# Patient Record
Sex: Female | Born: 1945
Health system: Southern US, Community
[De-identification: ages and names within clinical notes are randomized; demographics above are authoritative.]

## PROBLEM LIST (undated history)

## (undated) DIAGNOSIS — B019 Varicella without complication: Secondary | ICD-10-CM

## (undated) DIAGNOSIS — R42 Dizziness and giddiness: Secondary | ICD-10-CM

## (undated) DIAGNOSIS — M1990 Unspecified osteoarthritis, unspecified site: Secondary | ICD-10-CM

## (undated) DIAGNOSIS — H269 Unspecified cataract: Secondary | ICD-10-CM

## (undated) DIAGNOSIS — M810 Age-related osteoporosis without current pathological fracture: Secondary | ICD-10-CM

## (undated) HISTORY — DX: Age-related osteoporosis without current pathological fracture: M81.0

## (undated) HISTORY — DX: Varicella without complication: B01.9

## (undated) HISTORY — DX: Unspecified cataract: H26.9

## (undated) HISTORY — DX: Unspecified osteoarthritis, unspecified site: M19.90

## (undated) HISTORY — DX: Dizziness and giddiness: R42

---

## 2015-06-21 ENCOUNTER — Encounter: Payer: Self-pay | Admitting: Primary Care

## 2015-06-21 ENCOUNTER — Ambulatory Visit (INDEPENDENT_AMBULATORY_CARE_PROVIDER_SITE_OTHER): Payer: PPO | Admitting: Primary Care

## 2015-06-21 VITALS — BP 116/64 | HR 67 | Temp 97.9°F | Ht 61.0 in | Wt 111.1 lb

## 2015-06-21 DIAGNOSIS — H8110 Benign paroxysmal vertigo, unspecified ear: Secondary | ICD-10-CM | POA: Diagnosis not present

## 2015-06-21 DIAGNOSIS — M1990 Unspecified osteoarthritis, unspecified site: Secondary | ICD-10-CM

## 2015-06-21 DIAGNOSIS — M810 Age-related osteoporosis without current pathological fracture: Secondary | ICD-10-CM | POA: Diagnosis not present

## 2015-06-21 NOTE — Progress Notes (Signed)
Pre visit review using our clinic review tool, if applicable. No additional management support is needed unless otherwise documented below in the visit note. 

## 2015-06-21 NOTE — Progress Notes (Signed)
   Subjective:    Patient ID: Jackie Oneal, female    DOB: [birth year], 69 y.o.   MRN: [id number]  HPI  Jackie Oneal is a 69 year old female who presents today to establish care and discuss the problems mentioned below. Patient presents with old records, will review. Last physical was completed in April 2016.  1) Arthritis: Diagnosed years ago. Located to hands, hips, and upper spine. She will occasionally require tylenol. Overall her symptoms do not bother her on a day-to-day basis.  2) Vertigo: Intermittently for years. She will take an antihistamine occasionally when she develops episodes. Recent episode which is now resolving.   Review of Systems  Constitutional: Negative for unexpected weight change.  HENT: Negative for rhinorrhea.   Respiratory: Negative for cough and shortness of breath.   Cardiovascular: Negative for chest pain.  Gastrointestinal: Negative for diarrhea and constipation.  Genitourinary: Negative for difficulty urinating.  Musculoskeletal: Negative for myalgias and joint swelling.  Skin: Negative for rash.  Neurological: Negative for numbness and headaches.  Psychiatric/Behavioral:       Denies concerns for anxiety and depression.       Past Medical History  Diagnosis Date  . Arthritis   . Chicken pox   . Osteoporosis   . Vertigo     Social History   Social History  . Marital Status: Married    Spouse Name: N/A  . Number of Children: N/A  . Years of Education: N/A   Occupational History  . Not on file.   Social History Main Topics  . Smoking status: Never Smoker   . Smokeless tobacco: Not on file  . Alcohol Use: 0.0 oz/week    0 Standard drinks or equivalent per week     Comment: social  . Drug Use: Not on file  . Sexual Activity: Not on file   Other Topics Concern  . Not on file   Social History Narrative   Married.   2 sons. 3 grandchildren.   Retired. Worked as a Water quality scientist.    Enjoys walking, decorating, reading, sewing.     History reviewed. No pertinent past surgical history.  Family History  Problem Relation Age of Onset  . Arthritis Mother     Allergies  Allergen Reactions  . Sulfa Antibiotics     No current outpatient prescriptions on file prior to visit.   No current facility-administered medications on file prior to visit.    BP 116/64 mmHg  Pulse 67  Temp(Src) 97.9 F (36.6 C) (Oral)  Ht 5\' 1"  (1.549 m)  Wt 111 lb 1.9 oz (50.404 kg)  BMI 21.01 kg/m2  SpO2 99%    Objective:   Physical Exam  Constitutional: She is oriented to person, place, and time. She appears well-nourished.  Cardiovascular: Normal rate and regular rhythm.   Pulmonary/Chest: Effort normal and breath sounds normal.  Neurological: She is alert and oriented to person, place, and time.  Skin: Skin is warm and dry.  Psychiatric: She has a normal mood and affect.          Assessment & Plan:

## 2015-06-21 NOTE — Assessment & Plan Note (Signed)
Endorses history of. Will review records. Takes OTC calcium supplement.

## 2015-06-21 NOTE — Patient Instructions (Signed)
Please schedule a physical with me in April/May 2017. You will also schedule a lab only appointment one week prior. We will discuss your lab results during your physical.  It was a pleasure to meet you today! Please don't hesitate to call me with any questions. Welcome to [facility]!  Happy Thanksgiving!

## 2015-06-21 NOTE — Assessment & Plan Note (Signed)
Diagnosed years ago, located to hands, spine, hips. Occasional tylenol use. Good BMI and exercises daily.

## 2015-06-21 NOTE — Assessment & Plan Note (Signed)
History of for years. Manages well with occasional antihistamine.

## 2015-12-19 ENCOUNTER — Other Ambulatory Visit: Payer: Self-pay | Admitting: Primary Care

## 2015-12-19 DIAGNOSIS — Z1159 Encounter for screening for other viral diseases: Secondary | ICD-10-CM

## 2015-12-19 DIAGNOSIS — Z0000 Encounter for general adult medical examination without abnormal findings: Secondary | ICD-10-CM

## 2015-12-19 DIAGNOSIS — Z13220 Encounter for screening for lipoid disorders: Secondary | ICD-10-CM

## 2015-12-19 DIAGNOSIS — Z131 Encounter for screening for diabetes mellitus: Secondary | ICD-10-CM

## 2015-12-22 ENCOUNTER — Other Ambulatory Visit (INDEPENDENT_AMBULATORY_CARE_PROVIDER_SITE_OTHER): Payer: PPO

## 2015-12-22 DIAGNOSIS — Z131 Encounter for screening for diabetes mellitus: Secondary | ICD-10-CM

## 2015-12-22 DIAGNOSIS — Z13220 Encounter for screening for lipoid disorders: Secondary | ICD-10-CM | POA: Diagnosis not present

## 2015-12-22 DIAGNOSIS — Z0000 Encounter for general adult medical examination without abnormal findings: Secondary | ICD-10-CM

## 2015-12-22 DIAGNOSIS — Z1159 Encounter for screening for other viral diseases: Secondary | ICD-10-CM | POA: Diagnosis not present

## 2015-12-22 LAB — COMPREHENSIVE METABOLIC PANEL
ALBUMIN: 4.6 g/dL (ref 3.5–5.2)
ALT: 11 U/L (ref 0–35)
AST: 16 U/L (ref 0–37)
Alkaline Phosphatase: 49 U/L (ref 39–117)
BUN: 21 mg/dL (ref 6–23)
CHLORIDE: 104 meq/L (ref 96–112)
CO2: 29 meq/L (ref 19–32)
Calcium: 9.6 mg/dL (ref 8.4–10.5)
Creatinine, Ser: 0.85 mg/dL (ref 0.40–1.20)
GFR: 70.74 mL/min (ref >60.00)
Glucose, Bld: 90 mg/dL (ref 70–99)
POTASSIUM: 4.5 meq/L (ref 3.5–5.1)
SODIUM: 140 meq/L (ref 135–145)
Total Bilirubin: 0.9 mg/dL (ref 0.2–1.2)
Total Protein: 6.8 g/dL (ref 6.0–8.3)

## 2015-12-22 LAB — LIPID PANEL
Cholesterol: 213 mg/dL — ABNORMAL HIGH (ref 0–200)
HDL: 50.6 mg/dL (ref >39.00)
LDL CALC: 151 mg/dL — AB (ref 0–99)
NONHDL: 162.71
Total CHOL/HDL Ratio: 4
Triglycerides: 60 mg/dL (ref 0.0–149.0)
VLDL: 12 mg/dL (ref 0.0–40.0)

## 2015-12-22 LAB — HEMOGLOBIN A1C: HEMOGLOBIN A1C: 5.4 % (ref 4.6–6.5)

## 2015-12-22 LAB — VITAMIN D 25 HYDROXY (VIT D DEFICIENCY, FRACTURES): VITD: 24.94 ng/mL — ABNORMAL LOW (ref 30.00–100.00)

## 2015-12-23 LAB — HEPATITIS C ANTIBODY: HCV AB: NEGATIVE

## 2015-12-26 ENCOUNTER — Ambulatory Visit (INDEPENDENT_AMBULATORY_CARE_PROVIDER_SITE_OTHER): Payer: PPO | Admitting: Primary Care

## 2015-12-26 ENCOUNTER — Encounter: Payer: Self-pay | Admitting: Primary Care

## 2015-12-26 VITALS — BP 114/66 | HR 70 | Temp 98.1°F | Ht 61.0 in | Wt 112.0 lb

## 2015-12-26 DIAGNOSIS — Z0000 Encounter for general adult medical examination without abnormal findings: Secondary | ICD-10-CM | POA: Insufficient documentation

## 2015-12-26 DIAGNOSIS — E785 Hyperlipidemia, unspecified: Secondary | ICD-10-CM

## 2015-12-26 DIAGNOSIS — Z23 Encounter for immunization: Secondary | ICD-10-CM | POA: Diagnosis not present

## 2015-12-26 DIAGNOSIS — E559 Vitamin D deficiency, unspecified: Secondary | ICD-10-CM

## 2015-12-26 DIAGNOSIS — E2839 Other primary ovarian failure: Secondary | ICD-10-CM | POA: Diagnosis not present

## 2015-12-26 DIAGNOSIS — M810 Age-related osteoporosis without current pathological fracture: Secondary | ICD-10-CM

## 2015-12-26 DIAGNOSIS — L989 Disorder of the skin and subcutaneous tissue, unspecified: Secondary | ICD-10-CM

## 2015-12-26 DIAGNOSIS — M1990 Unspecified osteoarthritis, unspecified site: Secondary | ICD-10-CM

## 2015-12-26 DIAGNOSIS — Z1239 Encounter for other screening for malignant neoplasm of breast: Secondary | ICD-10-CM

## 2015-12-26 MED ORDER — ZOSTER VACCINE LIVE 19400 UNT/0.65ML ~~LOC~~ SUSR: 0.6500 mL | Freq: Once | SUBCUTANEOUS | Status: DC

## 2015-12-26 NOTE — Progress Notes (Signed)
Pre visit review using our clinic review tool, if applicable. No additional management support is needed unless otherwise documented below in the visit note. 

## 2015-12-26 NOTE — Assessment & Plan Note (Signed)
Slightly above goal, TC of 213, LDL of 151. Likely heredity as she is has low risk factors. Will continue to monitor. Repeat in 1 year.

## 2015-12-26 NOTE — Progress Notes (Signed)
Patient ID: Jackie Oneal, female   DOB: 06/23/1946, 70 y.o.   MRN: [id number]  HPI: Jackie Oneal is a 70 year old female who presents today for her annual wellness visit.  Past Medical History  Diagnosis Date  . Arthritis   . Chicken pox   . Osteoporosis   . Vertigo     Current Outpatient Prescriptions  Medication Sig Dispense Refill  . Lactase (LACTAID PO) Take by mouth daily as needed.      No current facility-administered medications for this visit.    Allergies  Allergen Reactions  . Sulfa Antibiotics     Family History  Problem Relation Age of Onset  . Arthritis Mother     Social History   Social History  . Marital Status: Married    Spouse Name: N/A  . Number of Children: N/A  . Years of Education: N/A   Occupational History  . Not on file.   Social History Main Topics  . Smoking status: Never Smoker   . Smokeless tobacco: Not on file  . Alcohol Use: 0.0 oz/week    0 Standard drinks or equivalent per week     Comment: social  . Drug Use: Not on file  . Sexual Activity: Not on file   Other Topics Concern  . Not on file   Social History Narrative   Married.   2 sons. 3 grandchildren.   Retired. Worked as a Water quality scientist.    Enjoys walking, decorating, reading, sewing.    Hospitiliaztions: None  Health Maintenance:    Flu: Completed in September 2016  Tetanus: Completed in 2015  Pneumovax: Completed in 2015  Prevnar: Has never completed. Due.  Zostavax: Has never completed. Due.  Bone Density: Completed in 2015. Due.  Colonoscopy: Completed in 2016, due in 10 years.  Eye Doctor: Completed 1 year ago. Scheduled for June 2017.  Dental Exam: Completed 1-2 years ago.  Mammogram: Completed in 2015. Due.  Pap: Completed in 2016    Providers: Alma Friendly, PCP   I have personally reviewed and have noted: 1. The patient's medical and social history 2. Their use of alcohol, tobacco or illicit drugs 3. Their current medications and  supplements 4. The patient's functional ability including ADL's, fall risks, home safety risks and  hearing or visual impairment. 5. Diet and physical activities 6. Evidence for depression or mood disorder  Subjective:   Review of Systems:   Constitutional: Denies fever, malaise, fatigue, headache or abrupt weight changes.  HEENT: Denies eye pain, eye redness, ear pain, ringing in the ears, wax buildup, runny nose, nasal congestion, bloody nose, or sore throat. Respiratory: Denies difficulty breathing, shortness of breath, cough or sputum production.   Cardiovascular: Denies chest pain, chest tightness, palpitations or swelling in the hands or feet.  Gastrointestinal: Denies abdominal pain, bloating, constipation, diarrhea or blood in the stool.  GU: Denies urgency, frequency, pain with urination, burning sensation, blood in urine, odor or discharge. Musculoskeletal: Denies decrease in range of motion, difficulty with gait, muscle pain. She's noticed increased swelling and stiffness to her fingers on the bilateral hands for the past 3 months. She will take Aleve every 2-3 days with reduction in inflammation and pain. Denies recent injury/trauma. Skin: She's noticed a new lesion to her left check with increased redness and swelling since moving to [place].  She has a history of precancerous lesions to her skin with 3 biopsies.  Neurological: Denies dizziness, difficulty with memory, difficulty with speech or problems with balance  and coordination.   No other specific complaints in a complete review of systems (except as listed in HPI above).  Objective:  PE:   BP 114/66 mmHg  Pulse 70  Temp(Src) 98.1 F (36.7 C) (Oral)  Ht 5\' 1"  (1.549 m)  Wt 112 lb (50.803 kg)  BMI 21.17 kg/m2  SpO2 98% Wt Readings from Last 3 Encounters:  12/26/15 112 lb (50.803 kg)  06/21/15 111 lb 1.9 oz (50.404 kg)    General: Appears their stated age, well developed, well nourished in NAD. Skin: Warm, dry and  intact. Mildly red lesion to left cheek,  HEENT: Head: normal shape and size; Eyes: sclera white, no icterus, conjunctiva pink, PERRLA and EOMs intact; Ears: Tm's gray and intact, normal light reflex; Nose: mucosa pink and moist, septum midline; Throat/Mouth: Teeth present, mucosa pink and moist, no exudate, lesions or ulcerations noted.  Neck: Normal range of motion. Neck supple, trachea midline. No massses, lumps or thyromegaly present.  Cardiovascular: Normal rate and rhythm. S1,S2 noted.  No murmur, rubs or gallops noted. No JVD or BLE edema. No carotid bruits noted. Pulmonary/Chest: Normal effort and positive vesicular breath sounds. No respiratory distress. No wheezes, rales or ronchi noted.  Abdomen: Soft and nontender. Normal bowel sounds, no bruits noted. No distention or masses noted. Liver, spleen and kidneys non palpable. Musculoskeletal: Normal range of motion. No difficulty with gait. No obvious joint swelling to the finger, but mild disfigurement to several DIP joints. Herbden's node noted to digit on left hand. Neurological: Alert and oriented. Cranial nerves II-XII intact. Coordination normal. +DTRs bilaterally. Psychiatric: Mood and affect normal. Behavior is normal. Judgment and thought content normal.   EKG:  BMET    Component Value Date/Time   NA 140 12/22/2015 0945   K 4.5 12/22/2015 0945   CL 104 12/22/2015 0945   CO2 29 12/22/2015 0945   GLUCOSE 90 12/22/2015 0945   BUN 21 12/22/2015 0945   CREATININE 0.85 12/22/2015 0945   CALCIUM 9.6 12/22/2015 0945    Lipid Panel     Component Value Date/Time   CHOL 213* 12/22/2015 0945   TRIG 60.0 12/22/2015 0945   HDL 50.60 12/22/2015 0945   CHOLHDL 4 12/22/2015 0945   VLDL 12.0 12/22/2015 0945   LDLCALC 151* 12/22/2015 0945    CBC No results found for: WBC, RBC, HGB, HCT, PLT, MCV, MCH, MCHC, RDW, LYMPHSABS, MONOABS, EOSABS, BASOSABS  Hgb A1C Lab Results  Component Value Date   HGBA1C 5.4 12/22/2015       Assessment and Plan:   Medicare Annual Wellness Visit:  Diet: She endorses a healthy diet. Physical activity: Active lifestyle with hiking, walking daily.  Depression/mood screen: Negative Hearing: Intact to whispered voice Visual acuity: Grossly normal, performs annual eye exam  ADLs: Capable Fall risk: None Home safety: Good Cognitive evaluation: Intact to orientation, naming, recall and repetition EOL planning: Adv directives, full code.  Preventative Medicine: Prevnar and Zostavax due. Administered Prevnar today, RX printed for Zostavax for her to obtain in 30 days. Mammogram and Bone density testing ordered. Colonoscopy, Pap, and other immunizations UTD. She is active and eats a healthy diet, commended her on this and encouraged her to continue. Labs today with hyperlipidemia that is slightly above goal, likely hereditary, and low vitamin D levels. Exam with mild joint disfigurement and new lesion to left cheek. Both of which are to be addressed, otherwise unremarkable. Referral placed to Dermatology.  Next appointment: 1 year or sooner if needed.

## 2015-12-26 NOTE — Assessment & Plan Note (Signed)
Vitamin D level of 24 on recent labs. Start Vitamin D 2000 units daily. Will continue to monitor. Bone density testing ordered.

## 2015-12-26 NOTE — Patient Instructions (Addendum)
Your vitamin D levels are too low. I recommend Vitamin D 2000 units daily. This may be purchased over the counter.  Your cholesterol is slightly above goal. We will continue to monitor these levels in the future. I recommend 150 minutes of moderate intensity exercise weekly.   Ensure you are consuming 64 ounces of water daily.  You've been provided with your final pneumonia vaccination today.   Take the prescription for the Shingles vaccination (zostavax) to your pharmacy as they will administer. You must wait at least 30 days from today before obtaining the shingles vaccination.   You will be contacted regarding your mammogram, bone density testing, and referral to Dermatology. Please let us know if you have not heard back within one week.   Follow up in 1 year for repeat physical or sooner if needed.  It was a pleasure to see you today!

## 2015-12-26 NOTE — Assessment & Plan Note (Signed)
Prevnar and Zostavax due. Administered Prevnar today, RX printed for Zostavax for her to obtain in 30 days. Mammogram and Bone density testing ordered. Colonoscopy, Pap, and other immunizations UTD. She is active and eats a healthy diet, commended her on this and encouraged her to continue. Labs today with hyperlipidemia that is slightly above goal, likely hereditary, and low vitamin D levels. Exam with mild joint disfigurement and new lesion to left cheek. Both of which are to be addressed, otherwise unremarkable. Referral placed to Dermatology.  I have personally reviewed and have noted: 1. The patient's medical and social history 2. Their use of alcohol, tobacco or illicit drugs 3. Their current medications and supplements 4. The patient's functional ability including ADL's, fall  risks, home safety risks and  hearing or visual  impairment. 5. Diet and physical activities 6. Evidence for depression or mood disorder

## 2015-12-26 NOTE — Assessment & Plan Note (Signed)
Osteoarthritis to hands, worse recently. Improvement with occasional Aleve. She would not like to pursue any further at this point. Would recommend xrays of hands in the future.

## 2015-12-26 NOTE — Assessment & Plan Note (Signed)
Due for repeat bone density testing, order placed. Will also start Vitamin D as she already takes Calcium. She leads an active lifestyle.

## 2016-01-10 ENCOUNTER — Ambulatory Visit: Payer: PPO

## 2016-01-11 ENCOUNTER — Ambulatory Visit
Admission: RE | Admit: 2016-01-11 | Discharge: 2016-01-11 | Disposition: A | Discharge status: Home / Self Care | Payer: PPO | Source: Ambulatory Visit | Attending: Primary Care | Admitting: Primary Care

## 2016-01-11 ENCOUNTER — Other Ambulatory Visit: Payer: Self-pay | Admitting: Primary Care

## 2016-01-11 DIAGNOSIS — M8588 Other specified disorders of bone density and structure, other site: Secondary | ICD-10-CM | POA: Diagnosis not present

## 2016-01-11 DIAGNOSIS — Z1239 Encounter for other screening for malignant neoplasm of breast: Secondary | ICD-10-CM

## 2016-01-11 DIAGNOSIS — M85852 Other specified disorders of bone density and structure, left thigh: Secondary | ICD-10-CM | POA: Insufficient documentation

## 2016-01-11 DIAGNOSIS — Z1231 Encounter for screening mammogram for malignant neoplasm of breast: Secondary | ICD-10-CM | POA: Diagnosis not present

## 2016-01-11 DIAGNOSIS — E2839 Other primary ovarian failure: Secondary | ICD-10-CM | POA: Diagnosis not present

## 2016-01-15 DIAGNOSIS — H2513 Age-related nuclear cataract, bilateral: Secondary | ICD-10-CM | POA: Diagnosis not present

## 2016-02-09 DIAGNOSIS — H43822 Vitreomacular adhesion, left eye: Secondary | ICD-10-CM | POA: Diagnosis not present

## 2016-03-22 DIAGNOSIS — H35342 Macular cyst, hole, or pseudohole, left eye: Secondary | ICD-10-CM | POA: Diagnosis not present

## 2016-03-26 DIAGNOSIS — L578 Other skin changes due to chronic exposure to nonionizing radiation: Secondary | ICD-10-CM | POA: Diagnosis not present

## 2016-03-26 DIAGNOSIS — L821 Other seborrheic keratosis: Secondary | ICD-10-CM | POA: Diagnosis not present

## 2016-03-26 DIAGNOSIS — D229 Melanocytic nevi, unspecified: Secondary | ICD-10-CM | POA: Diagnosis not present

## 2016-03-26 DIAGNOSIS — L570 Actinic keratosis: Secondary | ICD-10-CM | POA: Diagnosis not present

## 2016-03-26 DIAGNOSIS — D1800 Hemangioma unspecified site: Secondary | ICD-10-CM | POA: Diagnosis not present

## 2016-03-26 DIAGNOSIS — L814 Other melanin hyperpigmentation: Secondary | ICD-10-CM | POA: Diagnosis not present

## 2016-03-29 HISTORY — PX: EYE SURGERY: SHX253

## 2016-04-22 DIAGNOSIS — Z882 Allergy status to sulfonamides status: Secondary | ICD-10-CM | POA: Diagnosis not present

## 2016-04-22 DIAGNOSIS — H35342 Macular cyst, hole, or pseudohole, left eye: Secondary | ICD-10-CM | POA: Diagnosis not present

## 2016-04-22 DIAGNOSIS — H269 Unspecified cataract: Secondary | ICD-10-CM | POA: Diagnosis not present

## 2016-05-03 DIAGNOSIS — H35372 Puckering of macula, left eye: Secondary | ICD-10-CM | POA: Diagnosis not present

## 2016-05-21 ENCOUNTER — Ambulatory Visit (INDEPENDENT_AMBULATORY_CARE_PROVIDER_SITE_OTHER): Payer: PPO

## 2016-05-21 DIAGNOSIS — Z23 Encounter for immunization: Secondary | ICD-10-CM | POA: Diagnosis not present

## 2016-06-28 DIAGNOSIS — H35342 Macular cyst, hole, or pseudohole, left eye: Secondary | ICD-10-CM | POA: Diagnosis not present

## 2016-10-15 DIAGNOSIS — L821 Other seborrheic keratosis: Secondary | ICD-10-CM | POA: Diagnosis not present

## 2016-10-15 DIAGNOSIS — L814 Other melanin hyperpigmentation: Secondary | ICD-10-CM | POA: Diagnosis not present

## 2016-10-15 DIAGNOSIS — D1800 Hemangioma unspecified site: Secondary | ICD-10-CM | POA: Diagnosis not present

## 2016-10-15 DIAGNOSIS — L570 Actinic keratosis: Secondary | ICD-10-CM | POA: Diagnosis not present

## 2017-01-10 DIAGNOSIS — H35342 Macular cyst, hole, or pseudohole, left eye: Secondary | ICD-10-CM | POA: Diagnosis not present

## 2017-03-03 DIAGNOSIS — H2512 Age-related nuclear cataract, left eye: Secondary | ICD-10-CM | POA: Diagnosis not present

## 2017-03-07 DIAGNOSIS — H2512 Age-related nuclear cataract, left eye: Secondary | ICD-10-CM | POA: Diagnosis not present

## 2017-03-11 ENCOUNTER — Encounter: Payer: Self-pay | Admitting: *Deleted

## 2017-03-13 ENCOUNTER — Telehealth: Payer: Self-pay | Admitting: Primary Care

## 2017-03-13 NOTE — Telephone Encounter (Signed)
Left pt message asking to call Ebony Hail back directly at [phone] to schedule AWV + labs with Katha Cabal and CPE with PCP.  *NOTE* Last AWV 12/26/15

## 2017-03-18 ENCOUNTER — Ambulatory Visit: Payer: PPO | Admitting: Anesthesiology

## 2017-03-18 ENCOUNTER — Encounter
Admission: RE | Disposition: A | Discharge status: Home / Self Care | Payer: Self-pay | Source: Ambulatory Visit | Attending: Ophthalmology

## 2017-03-18 ENCOUNTER — Ambulatory Visit
Admission: RE | Admit: 2017-03-18 | Discharge: 2017-03-18 | Disposition: A | Discharge status: Home / Self Care | Payer: PPO | Source: Ambulatory Visit | Attending: Ophthalmology | Admitting: Ophthalmology

## 2017-03-18 ENCOUNTER — Encounter: Payer: Self-pay | Admitting: Anesthesiology

## 2017-03-18 DIAGNOSIS — H2512 Age-related nuclear cataract, left eye: Secondary | ICD-10-CM | POA: Insufficient documentation

## 2017-03-18 DIAGNOSIS — Z882 Allergy status to sulfonamides status: Secondary | ICD-10-CM | POA: Diagnosis not present

## 2017-03-18 HISTORY — PX: CATARACT EXTRACTION W/PHACO: SHX586

## 2017-03-18 SURGERY — PHACOEMULSIFICATION, CATARACT, WITH IOL INSERTION
Anesthesia: Monitor Anesthesia Care | Site: Eye | Laterality: Left | Wound class: Clean

## 2017-03-18 MED ORDER — MIDAZOLAM HCL 2 MG/2ML IJ SOLN
INTRAMUSCULAR | Status: AC
  Filled 2017-03-18: qty 2

## 2017-03-18 MED ORDER — NA CHONDROIT SULF-NA HYALURON 40-17 MG/ML IO SOLN
INTRAOCULAR | Status: DC | PRN
  Administered 2017-03-18: 1 mL via INTRAOCULAR

## 2017-03-18 MED ORDER — POVIDONE-IODINE 5 % OP SOLN
OPHTHALMIC | Status: DC | PRN
  Administered 2017-03-18: 1 via OPHTHALMIC

## 2017-03-18 MED ORDER — ARMC OPHTHALMIC DILATING DROPS
1.0000 "application " | OPHTHALMIC | Status: AC
  Administered 2017-03-18 (×3): 1 via OPHTHALMIC

## 2017-03-18 MED ORDER — CARBACHOL 0.01 % IO SOLN
INTRAOCULAR | Status: DC | PRN
  Administered 2017-03-18: 0.5 mL via INTRAOCULAR

## 2017-03-18 MED ORDER — LIDOCAINE HCL (PF) 4 % IJ SOLN
INTRAOCULAR | Status: DC | PRN
  Administered 2017-03-18: 4 mL via OPHTHALMIC

## 2017-03-18 MED ORDER — FENTANYL CITRATE (PF) 100 MCG/2ML IJ SOLN
INTRAMUSCULAR | Status: AC
  Filled 2017-03-18: qty 2

## 2017-03-18 MED ORDER — LIDOCAINE HCL (PF) 4 % IJ SOLN
INTRAMUSCULAR | Status: AC
  Filled 2017-03-18: qty 5

## 2017-03-18 MED ORDER — FENTANYL CITRATE (PF) 100 MCG/2ML IJ SOLN
INTRAMUSCULAR | Status: DC | PRN
  Administered 2017-03-18: 25 ug via INTRAVENOUS

## 2017-03-18 MED ORDER — EPHEDRINE SULFATE 50 MG/ML IJ SOLN
INTRAMUSCULAR | Status: DC | PRN
  Administered 2017-03-18: 10 mg via INTRAVENOUS

## 2017-03-18 MED ORDER — MOXIFLOXACIN HCL 0.5 % OP SOLN: 1.0000 [drp] | OPHTHALMIC | Status: DC | PRN

## 2017-03-18 MED ORDER — POVIDONE-IODINE 5 % OP SOLN
OPHTHALMIC | Status: AC
  Filled 2017-03-18: qty 30

## 2017-03-18 MED ORDER — EPINEPHRINE PF 1 MG/ML IJ SOLN
INTRAMUSCULAR | Status: AC
  Filled 2017-03-18: qty 2

## 2017-03-18 MED ORDER — SODIUM CHLORIDE 0.9 % IV SOLN
INTRAVENOUS | Status: DC
  Administered 2017-03-18: 12:00:00 via INTRAVENOUS

## 2017-03-18 MED ORDER — MOXIFLOXACIN HCL 0.5 % OP SOLN
OPHTHALMIC | Status: DC | PRN
  Administered 2017-03-18: 0.2 mL via OPHTHALMIC

## 2017-03-18 MED ORDER — MIDAZOLAM HCL 2 MG/2ML IJ SOLN
INTRAMUSCULAR | Status: DC | PRN
  Administered 2017-03-18: 2 mg via INTRAVENOUS

## 2017-03-18 MED ORDER — NA CHONDROIT SULF-NA HYALURON 40-17 MG/ML IO SOLN
INTRAOCULAR | Status: AC
  Filled 2017-03-18: qty 1

## 2017-03-18 MED ORDER — EPINEPHRINE PF 1 MG/ML IJ SOLN
INTRAOCULAR | Status: DC | PRN
  Administered 2017-03-18: 12:00:00 via OPHTHALMIC

## 2017-03-18 SURGICAL SUPPLY — 16 items
GLOVE BIO SURGEON STRL SZ8 (GLOVE) ×3 IMPLANT
GLOVE BIOGEL M 6.5 STRL (GLOVE) ×3 IMPLANT
GLOVE SURG LX 8.0 MICRO (GLOVE) ×2
GLOVE SURG LX STRL 8.0 MICRO (GLOVE) ×1 IMPLANT
GOWN STRL REUS W/ TWL LRG LVL3 (GOWN DISPOSABLE) ×2 IMPLANT
GOWN STRL REUS W/TWL LRG LVL3 (GOWN DISPOSABLE) ×4
LABEL CATARACT MEDS ST (LABEL) ×3 IMPLANT
LENS IOL TECNIS ITEC 20.0 (Intraocular Lens) ×3 IMPLANT
PACK CATARACT (MISCELLANEOUS) ×3 IMPLANT
PACK CATARACT BRASINGTON LX (MISCELLANEOUS) ×3 IMPLANT
PACK EYE AFTER SURG (MISCELLANEOUS) ×3 IMPLANT
SOL BSS BAG (MISCELLANEOUS) ×3
SOLUTION BSS BAG (MISCELLANEOUS) ×1 IMPLANT
SYR 5ML LL (SYRINGE) ×3 IMPLANT
WATER STERILE IRR 250ML POUR (IV SOLUTION) ×3 IMPLANT
WIPE NON LINTING 3.25X3.25 (MISCELLANEOUS) ×3 IMPLANT

## 2017-03-18 NOTE — Discharge Instructions (Signed)
Eye Surgery Discharge Instructions  Expect mild scratchy sensation or mild soreness. DO NOT RUB YOUR EYE!  The day of surgery:  Minimal physical activity, but bed rest is not required  No reading, computer work, or close hand work  No bending, lifting, or straining.  May watch TV  For 24 hours:  No driving, legal decisions, or alcoholic beverages  Safety precautions  Eat anything you prefer: It is better to start with liquids, then soup then solid foods.  _____ Eye patch should be worn until postoperative exam tomorrow.  ____ Solar shield eyeglasses should be worn for comfort in the sunlight/patch while sleeping  Resume all regular medications including aspirin or Coumadin if these were discontinued prior to surgery. You may shower, bathe, shave, or wash your hair. Tylenol may be taken for mild discomfort.  Call your doctor if you experience significant pain, nausea, or vomiting, fever > 101 or other signs of infection. [phone] or [phone] Specific instructions:  Follow-up Information    Birder Robson, MD Follow up.   Specialty:  Ophthalmology Why:  August 22 at 8:30am Contact information: [address] [place] [address] [phone]

## 2017-03-18 NOTE — Anesthesia Preprocedure Evaluation (Addendum)
Anesthesia Evaluation  Patient identified by MRN, date of birth, ID band Patient awake    Reviewed: Allergy & Precautions, NPO status , Patient's Chart, lab work & pertinent test results  Airway Mallampati: III  TM Distance: >3 FB     Dental  (+) Teeth Intact   Pulmonary neg pulmonary ROS,    Pulmonary exam normal        Cardiovascular negative cardio ROS Normal cardiovascular exam     Neuro/Psych vertigo negative psych ROS   GI/Hepatic negative GI ROS, Neg liver ROS,   Endo/Other  negative endocrine ROS  Renal/GU negative Renal ROS  negative genitourinary   Musculoskeletal  (+) Arthritis , Osteoarthritis,    Abdominal Normal abdominal exam  (+)   Peds negative pediatric ROS (+)  Hematology negative hematology ROS (+)   Anesthesia Other Findings Past Medical History: No date: Arthritis No date: Chicken pox No date: Osteoporosis No date: Vertigo  Reproductive/Obstetrics                            Anesthesia Physical Anesthesia Plan  ASA: II  Anesthesia Plan: MAC   Post-op Pain Management:    Induction: Intravenous  PONV Risk Score and Plan:   Airway Management Planned: Nasal Cannula  Additional Equipment:   Intra-op Plan:   Post-operative Plan:   Informed Consent: I have reviewed the patients History and Physical, chart, labs and discussed the procedure including the risks, benefits and alternatives for the proposed anesthesia with the patient or authorized representative who has indicated his/her understanding and acceptance.   Dental advisory given  Plan Discussed with: CRNA and Surgeon  Anesthesia Plan Comments:         Anesthesia Quick Evaluation

## 2017-03-18 NOTE — Anesthesia Postprocedure Evaluation (Signed)
Anesthesia Post Note  Patient: Jackie Oneal  Procedure(s) Performed: Procedure(s) (LRB): CATARACT EXTRACTION PHACO AND INTRAOCULAR LENS PLACEMENT (IOC) (Left)  Patient location during evaluation: PACU Anesthesia Type: MAC Level of consciousness: awake and alert Pain management: pain level controlled Vital Signs Assessment: post-procedure vital signs reviewed and stable Respiratory status: spontaneous breathing, nonlabored ventilation, respiratory function stable and patient connected to nasal cannula oxygen Cardiovascular status: stable and blood pressure returned to baseline Anesthetic complications: no     Last Vitals:  Vitals:   03/18/17 1110 03/18/17 1252  BP:  (!) 99/54  Pulse: 75 76  Resp: 16 15  Temp: (!) 35.6 C   SpO2: 100% 100%    Last Pain:  Vitals:   03/18/17 1252  TempSrc: Oral                 Darlyne Russian

## 2017-03-18 NOTE — H&P (Signed)
All labs reviewed. Abnormal studies sent to patients PCP when indicated.  Previous H&P reviewed, patient examined, there are NO CHANGES.  Jackie Oneal LOUIS8/21/201812:23 PM

## 2017-03-18 NOTE — Anesthesia Post-op Follow-up Note (Signed)
Anesthesia QCDR form completed.        

## 2017-03-18 NOTE — Op Note (Signed)
PREOPERATIVE DIAGNOSIS:  Nuclear sclerotic cataract of the left eye.   POSTOPERATIVE DIAGNOSIS:  Nuclear sclerotic cataract of the left eye.   OPERATIVE PROCEDURE: Procedure(s): CATARACT EXTRACTION PHACO AND INTRAOCULAR LENS PLACEMENT (IOC)   SURGEON:  Birder Robson, MD.   ANESTHESIA:  Anesthesiologist: Alvin Critchley, MD CRNA: Darlyne Russian, CRNA; Rolla Plate, CRNA  1.      Managed anesthesia care. 2.     0.49ml of Shugarcaine was instilled following the paracentesis   COMPLICATIONS:  None.   TECHNIQUE:   Stop and chop   DESCRIPTION OF PROCEDURE:  The patient was examined and consented in the preoperative holding area where the aforementioned topical anesthesia was applied to the left eye and then brought back to the Operating Room where the left eye was prepped and draped in the usual sterile ophthalmic fashion and a lid speculum was placed. A paracentesis was created with the side port blade and the anterior chamber was filled with viscoelastic. A near clear corneal incision was performed with the steel keratome. A continuous curvilinear capsulorrhexis was performed with a cystotome followed by the capsulorrhexis forceps. Hydrodissection and hydrodelineation were carried out with BSS on a blunt cannula. The lens was removed in a stop and chop  technique and the remaining cortical material was removed with the irrigation-aspiration handpiece. The capsular bag was inflated with viscoelastic and the Technis [id number] lens was placed in the capsular bag without complication. The remaining viscoelastic was removed from the eye with the irrigation-aspiration handpiece. The wounds were hydrated. The anterior chamber was flushed with Miostat and the eye was inflated to physiologic pressure. 0.64ml Vigamox was placed in the anterior chamber. The wounds were found to be water tight. The eye was dressed with Vigamox. The patient was given protective glasses to wear throughout the day and a shield with  which to sleep tonight. The patient was also given drops with which to begin a drop regimen today and will follow-up with me in one day.  Implant Name Type Inv. Item Serial No. Manufacturer Lot No. LRB No. Used  LENS IOL DIOP 20.0 - [id number] Intraocular Lens LENS IOL DIOP 20.0 [id number] AMO   Left 1    Procedure(s) with comments: CATARACT EXTRACTION PHACO AND INTRAOCULAR LENS PLACEMENT (IOC) (Left) - [place] 01:23.9 AP% 20.9 CDE 17.58 Fluid Pack lot # [id number]  Electronically signed: [place] 03/18/2017 12:50 PM

## 2017-03-18 NOTE — Transfer of Care (Signed)
Immediate Anesthesia Transfer of Care Note  Patient: Jackie Oneal  Procedure(s) Performed: Procedure(s) with comments: CATARACT EXTRACTION PHACO AND INTRAOCULAR LENS PLACEMENT (IOC) (Left) - [place] 01:23.9 AP% 20.9 CDE 17.58 Fluid Pack lot # [id number]  Patient Location: PACU  Anesthesia Type:MAC  Level of Consciousness: awake, alert  and oriented  Airway & Oxygen Therapy: Patient Spontanous Breathing  Post-op Assessment: Report given to RN and Post -op Vital signs reviewed and stable  Post vital signs: Reviewed and stable  Last Vitals:  Vitals:   03/18/17 1110 03/18/17 1252  BP:  (!) 99/54  Pulse: 75 77  Resp: 16 16  Temp: (!) 35.6 C   SpO2: 100% 100%    Last Pain:  Vitals:   03/18/17 1252  TempSrc: Oral         Complications: No apparent anesthesia complications

## 2017-03-18 NOTE — Anesthesia Procedure Notes (Signed)
Procedure Name: MAC Date/Time: 03/18/2017 12:32 PM Performed by: Darlyne Russian Pre-anesthesia Checklist: Patient identified, Emergency Drugs available, Suction available, Patient being monitored and Timeout performed Patient Re-evaluated:Patient Re-evaluated prior to induction Oxygen Delivery Method: Nasal cannula Placement Confirmation: positive ETCO2

## 2017-03-19 ENCOUNTER — Encounter: Payer: Self-pay | Admitting: Ophthalmology

## 2017-04-08 ENCOUNTER — Ambulatory Visit (INDEPENDENT_AMBULATORY_CARE_PROVIDER_SITE_OTHER): Payer: PPO

## 2017-04-08 VITALS — BP 100/64 | HR 76 | Temp 98.2°F | Ht 60.5 in | Wt 112.8 lb

## 2017-04-08 DIAGNOSIS — E7849 Other hyperlipidemia: Secondary | ICD-10-CM

## 2017-04-08 DIAGNOSIS — M818 Other osteoporosis without current pathological fracture: Secondary | ICD-10-CM | POA: Diagnosis not present

## 2017-04-08 DIAGNOSIS — Z23 Encounter for immunization: Secondary | ICD-10-CM | POA: Diagnosis not present

## 2017-04-08 DIAGNOSIS — Z0000 Encounter for general adult medical examination without abnormal findings: Secondary | ICD-10-CM

## 2017-04-08 DIAGNOSIS — E784 Other hyperlipidemia: Secondary | ICD-10-CM | POA: Diagnosis not present

## 2017-04-08 DIAGNOSIS — E559 Vitamin D deficiency, unspecified: Secondary | ICD-10-CM | POA: Diagnosis not present

## 2017-04-08 LAB — LIPID PANEL
CHOLESTEROL: 218 mg/dL — AB (ref 0–200)
HDL: 64.5 mg/dL (ref >39.00)
LDL CALC: 144 mg/dL — AB (ref 0–99)
NonHDL: 153.27
TRIGLYCERIDES: 47 mg/dL (ref 0.0–149.0)
Total CHOL/HDL Ratio: 3
VLDL: 9.4 mg/dL (ref 0.0–40.0)

## 2017-04-08 LAB — COMPREHENSIVE METABOLIC PANEL
ALK PHOS: 51 U/L (ref 39–117)
ALT: 12 U/L (ref 0–35)
AST: 17 U/L (ref 0–37)
Albumin: 4.7 g/dL (ref 3.5–5.2)
BILIRUBIN TOTAL: 0.9 mg/dL (ref 0.2–1.2)
BUN: 22 mg/dL (ref 6–23)
CO2: 26 mEq/L (ref 19–32)
Calcium: 9.9 mg/dL (ref 8.4–10.5)
Chloride: 103 mEq/L (ref 96–112)
Creatinine, Ser: 0.81 mg/dL (ref 0.40–1.20)
GFR: 74.5 mL/min (ref >60.00)
GLUCOSE: 92 mg/dL (ref 70–99)
Potassium: 3.8 mEq/L (ref 3.5–5.1)
SODIUM: 139 meq/L (ref 135–145)
TOTAL PROTEIN: 6.9 g/dL (ref 6.0–8.3)

## 2017-04-08 LAB — VITAMIN D 25 HYDROXY (VIT D DEFICIENCY, FRACTURES): VITD: 26.08 ng/mL — AB (ref 30.00–100.00)

## 2017-04-08 LAB — HEMOGLOBIN A1C: Hgb A1c MFr Bld: 5.2 % (ref 4.6–6.5)

## 2017-04-08 NOTE — Patient Instructions (Signed)
Jackie Oneal , Thank you for taking time to come for your Medicare Wellness Visit. I appreciate your ongoing commitment to your health goals. Please review the following plan we discussed and let me know if I can assist you in the future.   These are the goals we discussed: Goals    . Increase physical activity          Starting 04/08/2017, I will continue to walk at least 45 min 4-5 days per week.        This is a list of the screening recommended for you and due dates:  Health Maintenance  Topic Date Due  . Mammogram  01/10/2018  . Tetanus Vaccine  09/15/2023  . Colon Cancer Screening  01/09/2025  . Flu Shot  Completed  . DEXA scan (bone density measurement)  Completed  .  Hepatitis C: One time screening is recommended by Center for Disease Control  (CDC) for  adults born from [birth year] through 1963.   Completed  . Pneumonia vaccines  Completed   Preventive Care for Adults  A healthy lifestyle and preventive care can promote health and wellness. Preventive health guidelines for adults include the following key practices.  . A routine yearly physical is a good way to check with your health care provider about your health and preventive screening. It is a chance to share any concerns and updates on your health and to receive a thorough exam.  . Visit your dentist for a routine exam and preventive care every 6 months. Brush your teeth twice a day and floss once a day. Good oral hygiene prevents tooth decay and gum disease.  . The frequency of eye exams is based on your age, health, family medical history, use  of contact lenses, and other factors. Follow your health care provider's ecommendations for frequency of eye exams.  . Eat a healthy diet. Foods like vegetables, fruits, whole grains, low-fat dairy products, and lean protein foods contain the nutrients you need without too many calories. Decrease your intake of foods high in solid fats, added sugars, and salt. Eat the right amount  of calories for you. Get information about a proper diet from your health care provider, if necessary.  . Regular physical exercise is one of the most important things you can do for your health. Most adults should get at least 150 minutes of moderate-intensity exercise (any activity that increases your heart rate and causes you to sweat) each week. In addition, most adults need muscle-strengthening exercises on 2 or more days a week.  Silver Sneakers may be a benefit available to you. To determine eligibility, you may visit the website: www.silversneakers.com or contact program at [phone] Mon-Fri between 8AM-8PM.   . Maintain a healthy weight. The body mass index (BMI) is a screening tool to identify possible weight problems. It provides an estimate of body fat based on height and weight. Your health care provider can find your BMI and can help you achieve or maintain a healthy weight.   For adults 20 years and older: ? A BMI below 18.5 is considered underweight. ? A BMI of 18.5 to 24.9 is normal. ? A BMI of 25 to 29.9 is considered overweight. ? A BMI of 30 and above is considered obese.   . Maintain normal blood lipids and cholesterol levels by exercising and minimizing your intake of saturated fat. Eat a balanced diet with plenty of fruit and vegetables. Blood tests for lipids and cholesterol should begin at age 63  and be repeated every 5 years. If your lipid or cholesterol levels are high, you are over 50, or you are at high risk for heart disease, you may need your cholesterol levels checked more frequently. Ongoing high lipid and cholesterol levels should be treated with medicines if diet and exercise are not working.  . If you smoke, find out from your health care provider how to quit. If you do not use tobacco, please do not start.  . If you choose to drink alcohol, please do not consume more than 2 drinks per day. One drink is considered to be 12 ounces (355 mL) of beer, 5 ounces  (148 mL) of wine, or 1.5 ounces (44 mL) of liquor.  . If you are 70-22 years old, ask your health care provider if you should take aspirin to prevent strokes.  . Use sunscreen. Apply sunscreen liberally and repeatedly throughout the day. You should seek shade when your shadow is shorter than you. Protect yourself by wearing long sleeves, pants, a wide-brimmed hat, and sunglasses year round, whenever you are outdoors.  . Once a month, do a whole body skin exam, using a mirror to look at the skin on your back. Tell your health care provider of new moles, moles that have irregular borders, moles that are larger than a pencil eraser, or moles that have changed in shape or color.

## 2017-04-08 NOTE — Progress Notes (Signed)
PCP notes:   Health maintenance:  Flu vaccine - administered  Abnormal screenings:   None  Patient concerns:   None  Nurse concerns:  None  Next PCP appt:   05/07/17 @ 0815

## 2017-04-08 NOTE — Progress Notes (Signed)
Pre visit review using our clinic review tool, if applicable. No additional management support is needed unless otherwise documented below in the visit note. 

## 2017-04-08 NOTE — Progress Notes (Signed)
Subjective:   Jackie Oneal is a 71 y.o. female who presents for Medicare Annual (Subsequent) preventive examination.  Review of Systems:  N/A Cardiac Risk Factors include: advanced age (>47men, >67 women)     Objective:     Vitals: BP 100/64 (BP Location: Right Arm, Patient Position: Sitting, Cuff Size: Normal)   Pulse 76   Temp 98.2 F (36.8 C) (Oral)   Ht 5' 0.5" (1.537 m) Comment: no shoes  Wt 112 lb 12 oz (51.1 kg)   SpO2 98%   BMI 21.66 kg/m   Body mass index is 21.66 kg/m.   Tobacco History  Smoking Status  . Never Smoker  Smokeless Tobacco  . Never Used     Counseling given: No   Past Medical History:  Diagnosis Date  . Arthritis   . Chicken pox   . Osteoporosis   . Vertigo    Past Surgical History:  Procedure Laterality Date  . CATARACT EXTRACTION W/PHACO Left 03/18/2017   Procedure: CATARACT EXTRACTION PHACO AND INTRAOCULAR LENS PLACEMENT (IOC);  Surgeon: Birder Robson, MD;  Location: [facility] ORS;  Service: Ophthalmology;  Laterality: Left;  [place] 01:23.9 AP% 20.9 CDE 17.58 Fluid Pack lot # [id number]  . EYE SURGERY Left 03/2016   retina   Family History  Problem Relation Age of Onset  . Arthritis Mother    History  Sexual Activity  . Sexual activity: Not on file    Outpatient Encounter Prescriptions as of 04/08/2017  Medication Sig  . Lactase (LACTAID PO) Take by mouth daily as needed.   . [DISCONTINUED] Zoster Vaccine Live, PF, (ZOSTAVAX) 03474 UNT/0.65ML injection Inject 19,400 Units into the skin once. (Patient not taking: Reported on 03/11/2017)   No facility-administered encounter medications on file as of 04/08/2017.     Activities of Daily Living In your present state of health, do you have any difficulty performing the following activities: 04/08/2017  Hearing? N  Vision? N  Difficulty concentrating or making decisions? N  Walking or climbing stairs? N  Dressing or bathing? N  Doing errands, shopping? N  Preparing Food and  eating ? N  Using the Toilet? N  In the past six months, have you accidently leaked urine? N  Do you have problems with loss of bowel control? N  Managing your Medications? N  Managing your Finances? N  Housekeeping or managing your Housekeeping? N  Some recent data might be hidden    Patient Care Team: Pleas Koch, NP as PCP - General (Internal Medicine)    Assessment:     Hearing Screening   125Hz  250Hz  500Hz  1000Hz  2000Hz  3000Hz  4000Hz  6000Hz  8000Hz   Right ear:   40 40 40  40    Left ear:   40 40 40  40    Vision Screening Comments: Last vision exam in August 2017; future appt scheduled Oct 2018   Exercise Activities and Dietary recommendations Current Exercise Habits: Home exercise routine, Type of exercise: walking, Time (Minutes): 45, Frequency (Times/Week): 5, Weekly Exercise (Minutes/Week): 225, Intensity: Moderate, Exercise limited by: None identified  Goals    . Increase physical activity          Starting 04/08/2017, I will continue to walk at least 45 min 4-5 days per week.       Fall Risk Fall Risk  04/08/2017 12/26/2015  Falls in the past year? No No   Depression Screen PHQ 2/9 Scores 04/08/2017 12/26/2015  PHQ - 2 Score 0 0  PHQ- 9 Score  0 -     Cognitive Function MMSE - Mini Mental State Exam 04/08/2017  Orientation to time 5  Orientation to Place 5  Registration 3  Attention/ Calculation 0  Recall 3  Language- name 2 objects 0  Language- repeat 1  Language- follow 3 step command 3  Language- read & follow direction 0  Write a sentence 0  Copy design 0  Total score 20     PLEASE NOTE: A Mini-Cog screen was completed. Maximum score is 20. A value of 0 denotes this part of Folstein MMSE was not completed or the patient failed this part of the Mini-Cog screening.   Mini-Cog Screening Orientation to Time - Max 5 pts Orientation to Place - Max 5 pts Registration - Max 3 pts Recall - Max 3 pts Language Repeat - Max 1 pts Language Follow 3  Step Command - Max 3 pts     Immunization History  Administered Date(s) Administered  . DTaP 09/14/2013  . Influenza, High Dose Seasonal PF 04/18/2015  . Influenza,inj,Quad PF,6+ Mos 05/21/2016, 04/08/2017  . Pneumococcal Conjugate-13 12/26/2015  . Pneumococcal Polysaccharide-23 11/14/2013  . Tdap 09/14/2013  . Zoster 07/24/2016   Screening Tests Health Maintenance  Topic Date Due  . MAMMOGRAM  01/10/2018  . TETANUS/TDAP  09/15/2023  . COLONOSCOPY  01/09/2025  . INFLUENZA VACCINE  Completed  . DEXA SCAN  Completed  . Hepatitis C Screening  Completed  . PNA vac Low Risk Adult  Completed      Plan:     I have personally reviewed and addressed the Medicare Annual Wellness questionnaire and have noted the following in the patient's chart:  A. Medical and social history B. Use of alcohol, tobacco or illicit drugs  C. Current medications and supplements D. Functional ability and status E.  Nutritional status F.  Physical activity G. Advance directives H. List of other physicians I.  Hospitalizations, surgeries, and ER visits in previous 12 months J.  [place] to include hearing, vision, cognitive, depression L. Referrals and appointments - none  In addition, I have reviewed and discussed with patient certain preventive protocols, quality metrics, and best practice recommendations. A written personalized care plan for preventive services as well as general preventive health recommendations were provided to patient.  See attached scanned questionnaire for additional information.   Signed,   Lindell Noe, MHA, BS, LPN Health Coach

## 2017-04-09 NOTE — Progress Notes (Signed)
I reviewed health advisor's note, was available for consultation, and agree with documentation and plan.  

## 2017-04-16 NOTE — Telephone Encounter (Signed)
See 04/08/17

## 2017-05-05 DIAGNOSIS — Z961 Presence of intraocular lens: Secondary | ICD-10-CM | POA: Diagnosis not present

## 2017-05-05 DIAGNOSIS — H35342 Macular cyst, hole, or pseudohole, left eye: Secondary | ICD-10-CM | POA: Diagnosis not present

## 2017-05-07 ENCOUNTER — Encounter: Payer: Self-pay | Admitting: Primary Care

## 2017-05-07 ENCOUNTER — Ambulatory Visit (INDEPENDENT_AMBULATORY_CARE_PROVIDER_SITE_OTHER): Payer: PPO | Admitting: Primary Care

## 2017-05-07 VITALS — BP 112/66 | HR 77 | Temp 98.1°F | Ht 60.5 in | Wt 112.0 lb

## 2017-05-07 DIAGNOSIS — M810 Age-related osteoporosis without current pathological fracture: Secondary | ICD-10-CM

## 2017-05-07 DIAGNOSIS — Z0000 Encounter for general adult medical examination without abnormal findings: Secondary | ICD-10-CM | POA: Diagnosis not present

## 2017-05-07 DIAGNOSIS — E559 Vitamin D deficiency, unspecified: Secondary | ICD-10-CM | POA: Diagnosis not present

## 2017-05-07 DIAGNOSIS — E785 Hyperlipidemia, unspecified: Secondary | ICD-10-CM

## 2017-05-07 NOTE — Assessment & Plan Note (Signed)
Immunizations UTD. Pap, mammogram, bone density, colonoscopy UTD. Repeat Pap, mammogram, bone density in 2019. Recommended to increase exercise, vegetables/fruit/whole grains. Exam unremarkable. Labs stable. Follow up in 1 year.

## 2017-05-07 NOTE — Assessment & Plan Note (Signed)
Osteopenia on Bone Density from 2017, repeat in 2019. Start calcium with vitamin D.

## 2017-05-07 NOTE — Assessment & Plan Note (Addendum)
Discussed to start calcium with vitamin D. Increase exercise.

## 2017-05-07 NOTE — Assessment & Plan Note (Signed)
Stable when compared to 2017, likely hereditary given overall healthy diet and activity level. Will continue to monitor.

## 2017-05-07 NOTE — Progress Notes (Signed)
Subjective:    Patient ID: Jackie Oneal, female    DOB: 1946/03/28, 71 y.o.   MRN: [id number]  HPI  Ms. Farias is a 71 year old female who presents today for complete physical. She under went [place] 2 with our health advisor last week.  Immunizations: -Tetanus: Completed in 2015 -Influenza: Completed this season -Pneumonia: Completed Prevnar in 2017, Pneumovax in 2015 -Shingles: Completed in 2017  Diet: She endorses a healthy diet. Breakfast: Fruit, cereal  Lunch: Restaurants, meat, vegetable, starch Dinner: Pasta, meat, vegetable, starch Snacks: None Desserts: None Beverages: Water, unsweet tea, wine twice weekly  Exercise: Walking 1-2 miles 4 days weekly Eye exam: Completed in 2018, cataracts  Dental exam: Completes annually  Colonoscopy: Completed in 2016 Dexa: Completed in 2017 Pap Smear: Completed in 2016 Mammogram: Completed in 2019 Hep C Screen: Negative in 2017   Review of Systems  Constitutional: Negative for unexpected weight change.  HENT: Negative for rhinorrhea.   Respiratory: Negative for cough and shortness of breath.   Cardiovascular: Negative for chest pain.  Gastrointestinal: Negative for constipation and diarrhea.  Genitourinary: Negative for difficulty urinating and menstrual problem.  Musculoskeletal: Negative for arthralgias and myalgias.  Skin: Negative for rash.  Allergic/Immunologic: Negative for environmental allergies.  Neurological: Negative for dizziness, numbness and headaches.  Psychiatric/Behavioral:       Denies concerns for anxiety and depression       Past Medical History:  Diagnosis Date  . Arthritis   . Chicken pox   . Osteoporosis   . Vertigo      Social History   Social History  . Marital status: Married    Spouse name: N/A  . Number of children: N/A  . Years of education: N/A   Occupational History  . Not on file.   Social History Main Topics  . Smoking status: Never Smoker  . Smokeless tobacco:  Never Used  . Alcohol use 0.0 oz/week     Comment: social  . Drug use: No  . Sexual activity: Not on file   Other Topics Concern  . Not on file   Social History Narrative   Married.   2 sons. 3 grandchildren.   Retired. Worked as a Water quality scientist.    Enjoys walking, decorating, reading, sewing.    Past Surgical History:  Procedure Laterality Date  . CATARACT EXTRACTION W/PHACO Left 03/18/2017   Procedure: CATARACT EXTRACTION PHACO AND INTRAOCULAR LENS PLACEMENT (IOC);  Surgeon: Birder Robson, MD;  Location: [facility] ORS;  Service: Ophthalmology;  Laterality: Left;  [place] 01:23.9 AP% 20.9 CDE 17.58 Fluid Pack lot # [id number] H  . EYE SURGERY Left 03/2016   retina    Family History  Problem Relation Age of Onset  . Arthritis Mother     Allergies  Allergen Reactions  . Sulfa Antibiotics     Current Outpatient Prescriptions on File Prior to Visit  Medication Sig Dispense Refill  . Lactase (LACTAID PO) Take by mouth daily as needed.      No current facility-administered medications on file prior to visit.     BP 112/66   Pulse 77   Temp 98.1 F (36.7 C) (Oral)   Ht 5' 0.5" (1.537 m)   Wt 112 lb (50.8 kg)   SpO2 98%   BMI 21.51 kg/m    Objective:   Physical Exam  Constitutional: She is oriented to person, place, and time. She appears well-nourished.  HENT:  Right Ear: Tympanic membrane and ear canal normal.  Left Ear:  Tympanic membrane and ear canal normal.  Nose: Nose normal.  Mouth/Throat: Oropharynx is clear and moist.  Eyes: Pupils are equal, round, and reactive to light. Conjunctivae and EOM are normal.  Neck: Neck supple. No thyromegaly present.  Cardiovascular: Normal rate and regular rhythm.   No murmur heard. Pulmonary/Chest: Effort normal and breath sounds normal. She has no rales.  Abdominal: Soft. Bowel sounds are normal. There is no tenderness.  Musculoskeletal: Normal range of motion.  Lymphadenopathy:    She has no cervical adenopathy.    Neurological: She is alert and oriented to person, place, and time. She has normal reflexes. No cranial nerve deficit.  Skin: Skin is warm and dry. No rash noted.  Psychiatric: She has a normal mood and affect.          Assessment & Plan:

## 2017-05-07 NOTE — Patient Instructions (Addendum)
Continue exercising. You should be getting 150 minutes of moderate intensity exercise weekly.  We will repeat your mammogram and bone density tests in 2019.  Start calcium with vitamin D. You should be getting 1200 mg of calcium and 800 units of vitamin D daily.  Increase consumption of vegetables, fruit, whole grains.  Ensure you are consuming 64 ounces of water daily.  Follow up in 1 year for your annual exam or sooner if needed.  It was a pleasure to see you today!

## 2017-05-12 DIAGNOSIS — L814 Other melanin hyperpigmentation: Secondary | ICD-10-CM | POA: Diagnosis not present

## 2017-05-12 DIAGNOSIS — L578 Other skin changes due to chronic exposure to nonionizing radiation: Secondary | ICD-10-CM | POA: Diagnosis not present

## 2017-05-12 DIAGNOSIS — L570 Actinic keratosis: Secondary | ICD-10-CM | POA: Diagnosis not present

## 2017-07-29 DIAGNOSIS — H269 Unspecified cataract: Secondary | ICD-10-CM

## 2017-07-29 HISTORY — DX: Unspecified cataract: H26.9

## 2017-11-03 DIAGNOSIS — H2511 Age-related nuclear cataract, right eye: Secondary | ICD-10-CM | POA: Diagnosis not present

## 2017-11-10 DIAGNOSIS — L578 Other skin changes due to chronic exposure to nonionizing radiation: Secondary | ICD-10-CM | POA: Diagnosis not present

## 2017-11-10 DIAGNOSIS — L570 Actinic keratosis: Secondary | ICD-10-CM | POA: Diagnosis not present

## 2017-11-18 DIAGNOSIS — H2511 Age-related nuclear cataract, right eye: Secondary | ICD-10-CM | POA: Diagnosis not present

## 2017-11-19 ENCOUNTER — Encounter: Payer: Self-pay | Admitting: *Deleted

## 2017-11-19 NOTE — Pre-Procedure Instructions (Signed)
Heart and lung assessment missing from H&P. Faxed request to Dr. Inda Coke office.

## 2017-11-25 ENCOUNTER — Encounter
Admission: RE | Disposition: A | Discharge status: Home / Self Care | Payer: Self-pay | Source: Ambulatory Visit | Attending: Ophthalmology

## 2017-11-25 ENCOUNTER — Ambulatory Visit
Admission: RE | Admit: 2017-11-25 | Discharge: 2017-11-25 | Disposition: A | Discharge status: Home / Self Care | Payer: PPO | Source: Ambulatory Visit | Attending: Ophthalmology | Admitting: Ophthalmology

## 2017-11-25 ENCOUNTER — Ambulatory Visit: Payer: PPO | Admitting: Anesthesiology

## 2017-11-25 DIAGNOSIS — H2511 Age-related nuclear cataract, right eye: Secondary | ICD-10-CM | POA: Diagnosis not present

## 2017-11-25 DIAGNOSIS — Z79899 Other long term (current) drug therapy: Secondary | ICD-10-CM | POA: Insufficient documentation

## 2017-11-25 DIAGNOSIS — M8580 Other specified disorders of bone density and structure, unspecified site: Secondary | ICD-10-CM | POA: Insufficient documentation

## 2017-11-25 DIAGNOSIS — Z882 Allergy status to sulfonamides status: Secondary | ICD-10-CM | POA: Diagnosis not present

## 2017-11-25 HISTORY — PX: CATARACT EXTRACTION W/PHACO: SHX586

## 2017-11-25 SURGERY — PHACOEMULSIFICATION, CATARACT, WITH IOL INSERTION
Anesthesia: Monitor Anesthesia Care | Site: Eye | Laterality: Right | Wound class: Clean

## 2017-11-25 MED ORDER — ARMC OPHTHALMIC DILATING DROPS
1.0000 "application " | OPHTHALMIC | Status: AC
  Administered 2017-11-25 (×3): 1 via OPHTHALMIC

## 2017-11-25 MED ORDER — POVIDONE-IODINE 5 % OP SOLN
OPHTHALMIC | Status: DC | PRN
  Administered 2017-11-25: 1 via OPHTHALMIC

## 2017-11-25 MED ORDER — EPINEPHRINE PF 1 MG/ML IJ SOLN
INTRAOCULAR | Status: DC | PRN
  Administered 2017-11-25: 09:00:00 via OPHTHALMIC

## 2017-11-25 MED ORDER — NA CHONDROIT SULF-NA HYALURON 40-17 MG/ML IO SOLN
INTRAOCULAR | Status: DC | PRN
  Administered 2017-11-25: 1 mL via INTRAOCULAR

## 2017-11-25 MED ORDER — ARMC OPHTHALMIC DILATING DROPS
OPHTHALMIC | Status: AC
  Administered 2017-11-25: 1 via OPHTHALMIC
  Filled 2017-11-25: qty 0.4

## 2017-11-25 MED ORDER — NA CHONDROIT SULF-NA HYALURON 40-17 MG/ML IO SOLN
INTRAOCULAR | Status: AC
  Filled 2017-11-25: qty 1

## 2017-11-25 MED ORDER — EPINEPHRINE PF 1 MG/ML IJ SOLN
INTRAMUSCULAR | Status: AC
  Filled 2017-11-25: qty 2

## 2017-11-25 MED ORDER — FENTANYL CITRATE (PF) 100 MCG/2ML IJ SOLN
INTRAMUSCULAR | Status: DC | PRN
  Administered 2017-11-25: 50 ug via INTRAVENOUS

## 2017-11-25 MED ORDER — POVIDONE-IODINE 5 % OP SOLN
OPHTHALMIC | Status: AC
  Filled 2017-11-25: qty 30

## 2017-11-25 MED ORDER — LIDOCAINE HCL (PF) 4 % IJ SOLN
INTRAOCULAR | Status: DC | PRN
  Administered 2017-11-25: 4 mL via OPHTHALMIC

## 2017-11-25 MED ORDER — MOXIFLOXACIN HCL 0.5 % OP SOLN: 1.0000 [drp] | OPHTHALMIC | Status: DC | PRN

## 2017-11-25 MED ORDER — FENTANYL CITRATE (PF) 100 MCG/2ML IJ SOLN
INTRAMUSCULAR | Status: AC
  Filled 2017-11-25: qty 2

## 2017-11-25 MED ORDER — CARBACHOL 0.01 % IO SOLN
INTRAOCULAR | Status: DC | PRN
  Administered 2017-11-25: 0.5 mL via INTRAOCULAR

## 2017-11-25 MED ORDER — LIDOCAINE HCL (PF) 4 % IJ SOLN
INTRAMUSCULAR | Status: AC
  Filled 2017-11-25: qty 5

## 2017-11-25 MED ORDER — MIDAZOLAM HCL 2 MG/2ML IJ SOLN
INTRAMUSCULAR | Status: AC
  Filled 2017-11-25: qty 2

## 2017-11-25 MED ORDER — SODIUM CHLORIDE 0.9 % IV SOLN
INTRAVENOUS | Status: DC
  Administered 2017-11-25: 08:00:00 via INTRAVENOUS

## 2017-11-25 MED ORDER — MOXIFLOXACIN HCL 0.5 % OP SOLN
OPHTHALMIC | Status: DC | PRN
  Administered 2017-11-25: 0.2 mL via OPHTHALMIC

## 2017-11-25 MED ORDER — MOXIFLOXACIN HCL 0.5 % OP SOLN
OPHTHALMIC | Status: AC
  Filled 2017-11-25: qty 3

## 2017-11-25 MED ORDER — MIDAZOLAM HCL 2 MG/2ML IJ SOLN
INTRAMUSCULAR | Status: DC | PRN
  Administered 2017-11-25: 1 mg via INTRAVENOUS

## 2017-11-25 SURGICAL SUPPLY — 16 items
GLOVE BIO SURGEON STRL SZ8 (GLOVE) ×3 IMPLANT
GLOVE BIOGEL M 6.5 STRL (GLOVE) ×3 IMPLANT
GLOVE SURG LX 8.0 MICRO (GLOVE) ×2
GLOVE SURG LX STRL 8.0 MICRO (GLOVE) ×1 IMPLANT
GOWN STRL REUS W/ TWL LRG LVL3 (GOWN DISPOSABLE) ×2 IMPLANT
GOWN STRL REUS W/TWL LRG LVL3 (GOWN DISPOSABLE) ×4
LABEL CATARACT MEDS ST (LABEL) ×3 IMPLANT
LENS IOL TECNIS ITEC 18.0 (Intraocular Lens) ×3 IMPLANT
PACK CATARACT (MISCELLANEOUS) ×3 IMPLANT
PACK CATARACT BRASINGTON LX (MISCELLANEOUS) ×3 IMPLANT
PACK EYE AFTER SURG (MISCELLANEOUS) ×3 IMPLANT
SOL BSS BAG (MISCELLANEOUS) ×3
SOLUTION BSS BAG (MISCELLANEOUS) ×1 IMPLANT
SYR 5ML LL (SYRINGE) ×3 IMPLANT
WATER STERILE IRR 250ML POUR (IV SOLUTION) ×3 IMPLANT
WIPE NON LINTING 3.25X3.25 (MISCELLANEOUS) ×3 IMPLANT

## 2017-11-25 NOTE — H&P (Signed)
All labs reviewed. Abnormal studies sent to patients PCP when indicated.  Previous H&P reviewed, patient examined, there are NO CHANGES.  Jackie Laurie Porfilio4/30/20199:15 AM

## 2017-11-25 NOTE — Anesthesia Post-op Follow-up Note (Signed)
Anesthesia QCDR form completed.        

## 2017-11-25 NOTE — Anesthesia Postprocedure Evaluation (Signed)
Anesthesia Post Note  Patient: Jackie Oneal  Procedure(s) Performed: CATARACT EXTRACTION PHACO AND INTRAOCULAR LENS PLACEMENT ([place]) (Right Eye)  Patient location during evaluation: PACU Anesthesia Type: MAC Level of consciousness: awake Pain management: pain level controlled Vital Signs Assessment: post-procedure vital signs reviewed and stable Respiratory status: spontaneous breathing Cardiovascular status: blood pressure returned to baseline Postop Assessment: no apparent nausea or vomiting Anesthetic complications: no     Last Vitals:  Vitals:   11/25/17 0755 11/25/17 0812  BP: 102/61   Pulse: 74   Resp: 16   Temp:  (!) 36.1 C  SpO2: 100%     Last Pain:  Vitals:   11/25/17 0812  TempSrc: Tympanic                 Carron Curie

## 2017-11-25 NOTE — Op Note (Signed)
PREOPERATIVE DIAGNOSIS:  Nuclear sclerotic cataract of the right eye.   POSTOPERATIVE DIAGNOSIS:  NUCLEAR SCLEROTIC CATARACT RIGHT EYE   OPERATIVE PROCEDURE: Procedure(s): CATARACT EXTRACTION PHACO AND INTRAOCULAR LENS PLACEMENT (I[facility])   SURGEON:  Birder Robson, MD.   ANESTHESIA:  Anesthesiologist: Molli Barrows, MD CRNA: Carron Curie, CRNA  1.      Managed anesthesia care. 2.      0.48ml of Shugarcaine was instilled in the eye following the paracentesis.   COMPLICATIONS:  None.   TECHNIQUE:   Stop and chop   DESCRIPTION OF PROCEDURE:  The patient was examined and consented in the preoperative holding area where the aforementioned topical anesthesia was applied to the right eye and then brought back to the Operating Room where the right eye was prepped and draped in the usual sterile ophthalmic fashion and a lid speculum was placed. A paracentesis was created with the side port blade and the anterior chamber was filled with viscoelastic. A near clear corneal incision was performed with the steel keratome. A continuous curvilinear capsulorrhexis was performed with a cystotome followed by the capsulorrhexis forceps. Hydrodissection and hydrodelineation were carried out with BSS on a blunt cannula. The lens was removed in a stop and chop  technique and the remaining cortical material was removed with the irrigation-aspiration handpiece. The capsular bag was inflated with viscoelastic and the Technis [id number]  lens was placed in the capsular bag without complication. The remaining viscoelastic was removed from the eye with the irrigation-aspiration handpiece. The wounds were hydrated. The anterior chamber was flushed with Miostat and the eye was inflated to physiologic pressure. 0.17ml of Vigamox was placed in the anterior chamber. The wounds were found to be water tight. The eye was dressed with Vigamox. The patient was given protective glasses to wear throughout the day and a shield with which  to sleep tonight. The patient was also given drops with which to begin a drop regimen today and will follow-up with me in one day. Implant Name Type Inv. Item Serial No. Manufacturer Lot No. LRB No. Used  LENS IOL DIOP 18.0 - [id number] Intraocular Lens LENS IOL DIOP 18.0 [id number] AMO  Right 1   Procedure(s) with comments: CATARACT EXTRACTION PHACO AND INTRAOCULAR LENS PLACEMENT (IOC) (Right) - [place] 00:41.5 AP% 16.7 CDE 6.95 Fluid Pack Lot # [id number]  Electronically signed: Birder Robson 11/25/2017 9:40 AM

## 2017-11-25 NOTE — Anesthesia Preprocedure Evaluation (Signed)
Anesthesia Evaluation  Patient identified by MRN, date of birth, ID band Patient awake    Reviewed: Allergy & Precautions, H&P , NPO status , Patient's Chart, lab work & pertinent test results, reviewed documented beta blocker date and time   Airway Mallampati: II  TM Distance: >3 FB Neck ROM: full    Dental no notable dental hx. (+) Teeth Intact   Pulmonary neg pulmonary ROS,    Pulmonary exam normal breath sounds clear to auscultation       Cardiovascular Exercise Tolerance: Good hypertension, + Peripheral Vascular Disease  negative cardio ROS   Rhythm:regular Rate:Normal     Neuro/Psych negative neurological ROS  negative psych ROS   GI/Hepatic negative GI ROS, Neg liver ROS,   Endo/Other  negative endocrine ROSdiabetes  Renal/GU      Musculoskeletal   Abdominal   Peds  Hematology negative hematology ROS (+)   Anesthesia Other Findings   Reproductive/Obstetrics negative OB ROS                             Anesthesia Physical Anesthesia Plan  ASA: II  Anesthesia Plan: MAC   Post-op Pain Management:    Induction:   PONV Risk Score and Plan:   Airway Management Planned:   Additional Equipment:   Intra-op Plan:   Post-operative Plan:   Informed Consent: I have reviewed the patients History and Physical, chart, labs and discussed the procedure including the risks, benefits and alternatives for the proposed anesthesia with the patient or authorized representative who has indicated his/her understanding and acceptance.     Plan Discussed with: CRNA  Anesthesia Plan Comments:         Anesthesia Quick Evaluation

## 2017-11-25 NOTE — Transfer of Care (Signed)
Immediate Anesthesia Transfer of Care Note  Patient: Jackie Oneal  Procedure(s) Performed: CATARACT EXTRACTION PHACO AND INTRAOCULAR LENS PLACEMENT (IOC) (Right Eye)  Patient Location: PACU  Anesthesia Type:MAC  Level of Consciousness: awake  Airway & Oxygen Therapy: Patient Spontanous Breathing  Post-op Assessment: Report given to RN  Post vital signs: stable  Last Vitals:  Vitals Value Taken Time  BP    Temp    Pulse    Resp    SpO2      Last Pain:  Vitals:   11/25/17 0812  TempSrc: Tympanic         Complications: No apparent anesthesia complications

## 2017-11-25 NOTE — Discharge Instructions (Signed)
Eye Surgery Discharge Instructions  Expect mild scratchy sensation or mild soreness. DO NOT RUB YOUR EYE!  The day of surgery:  Minimal physical activity, but bed rest is not required  No reading, computer work, or close hand work  No bending, lifting, or straining.  May watch TV  For 24 hours:  No driving, legal decisions, or alcoholic beverages  Safety precautions  Eat anything you prefer: It is better to start with liquids, then soup then solid foods.  _____ Eye patch should be worn until postoperative exam tomorrow.  ____ Solar shield eyeglasses should be worn for comfort in the sunlight/patch while sleeping  Resume all regular medications including aspirin or Coumadin if these were discontinued prior to surgery. You may shower, bathe, shave, or wash your hair. Tylenol may be taken for mild discomfort.  Call your doctor if you experience significant pain, nausea, or vomiting, fever > 101 or other signs of infection. [phone] or [phone] Specific instructions:  Follow-up Information    Birder Robson, MD Follow up on 11/26/2017.   Specialty:  Ophthalmology Why:  10:55 Contact information: [address] [place] [address] [phone]

## 2018-03-19 ENCOUNTER — Observation Stay
Admit: 2018-03-19 | Discharge: 2018-03-19 | Disposition: A | Discharge status: Home / Self Care | Payer: PPO | Attending: Family Medicine | Admitting: Family Medicine

## 2018-03-19 ENCOUNTER — Observation Stay
Admission: EM | Admit: 2018-03-19 | Discharge: 2018-03-20 | Disposition: A | Discharge status: Home / Self Care | Payer: PPO | Attending: Internal Medicine | Admitting: Internal Medicine

## 2018-03-19 ENCOUNTER — Emergency Department: Payer: PPO

## 2018-03-19 ENCOUNTER — Observation Stay: Payer: PPO

## 2018-03-19 ENCOUNTER — Encounter: Payer: Self-pay | Admitting: Emergency Medicine

## 2018-03-19 ENCOUNTER — Other Ambulatory Visit: Payer: Self-pay

## 2018-03-19 DIAGNOSIS — M810 Age-related osteoporosis without current pathological fracture: Secondary | ICD-10-CM | POA: Diagnosis not present

## 2018-03-19 DIAGNOSIS — I700 Atherosclerosis of aorta: Secondary | ICD-10-CM | POA: Insufficient documentation

## 2018-03-19 DIAGNOSIS — E739 Lactose intolerance, unspecified: Secondary | ICD-10-CM | POA: Diagnosis not present

## 2018-03-19 DIAGNOSIS — R0789 Other chest pain: Secondary | ICD-10-CM | POA: Diagnosis not present

## 2018-03-19 DIAGNOSIS — I951 Orthostatic hypotension: Principal | ICD-10-CM | POA: Insufficient documentation

## 2018-03-19 DIAGNOSIS — M1990 Unspecified osteoarthritis, unspecified site: Secondary | ICD-10-CM | POA: Diagnosis not present

## 2018-03-19 DIAGNOSIS — S42009A Fracture of unspecified part of unspecified clavicle, initial encounter for closed fracture: Secondary | ICD-10-CM | POA: Diagnosis not present

## 2018-03-19 DIAGNOSIS — S01112A Laceration without foreign body of left eyelid and periocular area, initial encounter: Secondary | ICD-10-CM | POA: Insufficient documentation

## 2018-03-19 DIAGNOSIS — R55 Syncope and collapse: Secondary | ICD-10-CM

## 2018-03-19 DIAGNOSIS — E878 Other disorders of electrolyte and fluid balance, not elsewhere classified: Secondary | ICD-10-CM | POA: Diagnosis not present

## 2018-03-19 DIAGNOSIS — S0990XA Unspecified injury of head, initial encounter: Secondary | ICD-10-CM

## 2018-03-19 DIAGNOSIS — Z882 Allergy status to sulfonamides status: Secondary | ICD-10-CM | POA: Insufficient documentation

## 2018-03-19 DIAGNOSIS — E871 Hypo-osmolality and hyponatremia: Secondary | ICD-10-CM | POA: Insufficient documentation

## 2018-03-19 DIAGNOSIS — R079 Chest pain, unspecified: Secondary | ICD-10-CM | POA: Diagnosis not present

## 2018-03-19 DIAGNOSIS — W19XXXA Unspecified fall, initial encounter: Secondary | ICD-10-CM | POA: Diagnosis not present

## 2018-03-19 DIAGNOSIS — S0181XA Laceration without foreign body of other part of head, initial encounter: Secondary | ICD-10-CM | POA: Diagnosis not present

## 2018-03-19 DIAGNOSIS — I6523 Occlusion and stenosis of bilateral carotid arteries: Secondary | ICD-10-CM | POA: Diagnosis not present

## 2018-03-19 DIAGNOSIS — S42035A Nondisplaced fracture of lateral end of left clavicle, initial encounter for closed fracture: Secondary | ICD-10-CM | POA: Diagnosis not present

## 2018-03-19 DIAGNOSIS — S42022A Displaced fracture of shaft of left clavicle, initial encounter for closed fracture: Secondary | ICD-10-CM | POA: Diagnosis not present

## 2018-03-19 DIAGNOSIS — S060X0A Concussion without loss of consciousness, initial encounter: Secondary | ICD-10-CM | POA: Diagnosis not present

## 2018-03-19 DIAGNOSIS — S42002A Fracture of unspecified part of left clavicle, initial encounter for closed fracture: Secondary | ICD-10-CM | POA: Diagnosis not present

## 2018-03-19 DIAGNOSIS — S42032A Displaced fracture of lateral end of left clavicle, initial encounter for closed fracture: Secondary | ICD-10-CM | POA: Insufficient documentation

## 2018-03-19 HISTORY — DX: Syncope and collapse: R55

## 2018-03-19 LAB — CBC WITH DIFFERENTIAL/PLATELET
Basophils Absolute: 0 10*3/uL (ref 0–0.1)
Basophils Relative: 0 %
EOS PCT: 2 %
Eosinophils Absolute: 0.1 10*3/uL (ref 0–0.7)
HEMATOCRIT: 35.8 % (ref 35.0–47.0)
Hemoglobin: 12.5 g/dL (ref 12.0–16.0)
LYMPHS ABS: 1.4 10*3/uL (ref 1.0–3.6)
LYMPHS PCT: 20 %
MCH: 31.9 pg (ref 26.0–34.0)
MCHC: 35 g/dL (ref 32.0–36.0)
MCV: 91.3 fL (ref 80.0–100.0)
MONO ABS: 0.6 10*3/uL (ref 0.2–0.9)
MONOS PCT: 8 %
NEUTROS ABS: 5.3 10*3/uL (ref 1.4–6.5)
Neutrophils Relative %: 70 %
PLATELETS: 181 10*3/uL (ref 150–440)
RBC: 3.92 MIL/uL (ref 3.80–5.20)
RDW: 13.4 % (ref 11.5–14.5)
WBC: 7.4 10*3/uL (ref 3.6–11.0)

## 2018-03-19 LAB — COMPREHENSIVE METABOLIC PANEL
ALT: 16 U/L (ref 0–44)
ANION GAP: 10 (ref 5–15)
AST: 21 U/L (ref 15–41)
Albumin: 4.2 g/dL (ref 3.5–5.0)
Alkaline Phosphatase: 59 U/L (ref 38–126)
BUN: 8 mg/dL (ref 8–23)
CO2: 25 mmol/L (ref 22–32)
Calcium: 9 mg/dL (ref 8.9–10.3)
Chloride: 97 mmol/L — ABNORMAL LOW (ref 98–111)
Creatinine, Ser: 0.75 mg/dL (ref 0.44–1.00)
Glucose, Bld: 126 mg/dL — ABNORMAL HIGH (ref 70–99)
POTASSIUM: 3.5 mmol/L (ref 3.5–5.1)
Sodium: 132 mmol/L — ABNORMAL LOW (ref 135–145)
TOTAL PROTEIN: 6.9 g/dL (ref 6.5–8.1)
Total Bilirubin: 1 mg/dL (ref 0.3–1.2)

## 2018-03-19 LAB — TROPONIN I

## 2018-03-19 LAB — FIBRIN DERIVATIVES D-DIMER (ARMC ONLY): FIBRIN DERIVATIVES D-DIMER (ARMC): 505.28 ng{FEU}/mL — AB (ref 0.00–499.00)

## 2018-03-19 MED ORDER — NITROGLYCERIN 0.4 MG SL SUBL: 0.4000 mg | SUBLINGUAL_TABLET | SUBLINGUAL | Status: DC | PRN

## 2018-03-19 MED ORDER — CALCIUM-VITAMIN D3 500-400 MG-UNIT PO TABS: ORAL_TABLET | Freq: Every day | ORAL | Status: DC

## 2018-03-19 MED ORDER — ONDANSETRON HCL 4 MG/2ML IJ SOLN: 4.0000 mg | Freq: Four times a day (QID) | INTRAMUSCULAR | Status: DC | PRN

## 2018-03-19 MED ORDER — SODIUM CHLORIDE 0.9 % IV SOLN
Freq: Once | INTRAVENOUS | Status: AC
  Administered 2018-03-19: 15:00:00 via INTRAVENOUS

## 2018-03-19 MED ORDER — SODIUM CHLORIDE 0.9 % IV SOLN
INTRAVENOUS | Status: AC
  Administered 2018-03-19: 16:00:00 via INTRAVENOUS

## 2018-03-19 MED ORDER — ENOXAPARIN SODIUM 40 MG/0.4ML ~~LOC~~ SOLN
40.0000 mg | Freq: Every day | SUBCUTANEOUS | Status: DC
  Administered 2018-03-19 – 2018-03-20 (×2): 40 mg via SUBCUTANEOUS
  Filled 2018-03-19 (×2): qty 0.4

## 2018-03-19 MED ORDER — CALCIUM CARBONATE-VITAMIN D 500-200 MG-UNIT PO TABS
1.0000 | ORAL_TABLET | Freq: Every day | ORAL | Status: DC
  Administered 2018-03-20: 1 via ORAL
  Filled 2018-03-19: qty 1

## 2018-03-19 MED ORDER — MORPHINE SULFATE (PF) 2 MG/ML IV SOLN
2.0000 mg | INTRAVENOUS | Status: DC | PRN
  Administered 2018-03-19 – 2018-03-20 (×7): 2 mg via INTRAVENOUS
  Filled 2018-03-19 (×7): qty 1

## 2018-03-19 MED ORDER — IOPAMIDOL (ISOVUE-370) INJECTION 76%
75.0000 mL | Freq: Once | INTRAVENOUS | Status: AC | PRN
  Administered 2018-03-19: 75 mL via INTRAVENOUS
  Filled 2018-03-19: qty 75

## 2018-03-19 MED ORDER — ACETAMINOPHEN 325 MG PO TABS: 650.0000 mg | ORAL_TABLET | ORAL | Status: DC | PRN

## 2018-03-19 MED ORDER — GI COCKTAIL ~~LOC~~
30.0000 mL | Freq: Four times a day (QID) | ORAL | Status: DC | PRN
  Filled 2018-03-19: qty 30

## 2018-03-19 MED ORDER — ASPIRIN EC 325 MG PO TBEC
325.0000 mg | DELAYED_RELEASE_TABLET | Freq: Every day | ORAL | Status: DC
  Administered 2018-03-19 – 2018-03-20 (×2): 325 mg via ORAL
  Filled 2018-03-19 (×2): qty 1

## 2018-03-19 MED ORDER — ALPRAZOLAM 0.25 MG PO TABS: 0.2500 mg | ORAL_TABLET | Freq: Two times a day (BID) | ORAL | Status: DC | PRN

## 2018-03-19 MED ORDER — LACTASE 3000 UNITS PO TABS
3000.0000 [IU] | ORAL_TABLET | Freq: Every day | ORAL | Status: DC | PRN
  Filled 2018-03-19: qty 1

## 2018-03-19 NOTE — Progress Notes (Signed)
*  PRELIMINARY RESULTS* Echocardiogram 2D Echocardiogram has been performed.  Jackie Oneal 03/19/2018, 3:02 PM

## 2018-03-19 NOTE — ED Provider Notes (Signed)
[facility] Emergency Department Provider Note       Time seen: ----------------------------------------- 7:22 AM on 03/19/2018 -----------------------------------------   I have reviewed the triage vital signs and the nursing notes.  HISTORY   Chief Complaint Loss of Consciousness; Chest Pain; and Shoulder Pain    HPI Jackie Oneal is a 72 y.o. female with a history of arthritis, osteoporosis, vertigo who presents to the ED for syncope.  Patient awoke with midsternal burning chest pain that began around 630 this morning.  Patient states she got up to get water, felt dizzy and passed out.  She hit her head on a glass face and got to pick up the face and passed out again hitting the back of her head on the floor.  She was complaining of a knot to the back of her head, she sustained a laceration near her left eyebrow and she is complaining of left shoulder pain.  She denies any chest pain at this time, denies any recent illness, she does not take any prescription medication.  Past Medical History:  Diagnosis Date  . Arthritis   . Chicken pox   . Osteoporosis   . Vertigo     Patient Active Problem List   Diagnosis Date Noted  . Preventative health care 05/07/2017  . Medicare annual wellness visit, subsequent 12/26/2015  . Hyperlipidemia 12/26/2015  . Vitamin D deficiency 12/26/2015  . Osteoporosis 06/21/2015  . Arthritis 06/21/2015  . Benign paroxysmal positional vertigo 06/21/2015    Past Surgical History:  Procedure Laterality Date  . CATARACT EXTRACTION W/PHACO Left 03/18/2017   Procedure: CATARACT EXTRACTION PHACO AND INTRAOCULAR LENS PLACEMENT (IOC);  Surgeon: Birder Robson, MD;  Location: [facility] ORS;  Service: Ophthalmology;  Laterality: Left;  [place] 01:23.9 AP% 20.9 CDE 17.58 Fluid Pack lot # [id number]  . CATARACT EXTRACTION W/PHACO Right 11/25/2017   Procedure: CATARACT EXTRACTION PHACO AND INTRAOCULAR LENS PLACEMENT (IOC);  Surgeon:  Birder Robson, MD;  Location: [facility] ORS;  Service: Ophthalmology;  Laterality: Right;  [place] 00:41.5 AP% 16.7 CDE 6.95 Fluid Pack Lot # [id number]  . EYE SURGERY Left 03/2016   retina    Allergies Sulfa antibiotics  Social History Social History   Tobacco Use  . Smoking status: Never Smoker  . Smokeless tobacco: Never Used  Substance Use Topics  . Alcohol use: Yes    Alcohol/week: 0.0 standard drinks    Comment: social  . Drug use: No   Review of Systems Constitutional: Negative for fever. Eyes: Negative for vision changes ENT: Positive for laceration along her left eyebrow laterally Cardiovascular: Positive for recent chest pain Respiratory: Negative for shortness of breath. Gastrointestinal: Negative for abdominal pain, vomiting and diarrhea. Musculoskeletal: Positive left shoulder pain Skin: Positive for forehead laceration, contusion Neurological: Positive for headache  All systems negative/normal/unremarkable except as stated in the HPI  ____________________________________________   PHYSICAL EXAM:  VITAL SIGNS: ED Triage Vitals  Enc Vitals Group     BP 03/19/18 0702 102/60     Pulse Rate 03/19/18 0702 74     Resp 03/19/18 0702 16     Temp --      Temp src --      SpO2 03/19/18 0702 100 %     Weight 03/19/18 0707 110 lb (49.9 kg)     Height 03/19/18 0707 5\' 1"  (1.549 m)     Head Circumference --      Peak Flow --      Pain Score 03/19/18 0706 6  Pain Loc --      Pain Edu? --      Excl. in [place]? --    Constitutional: Alert and oriented. Well appearing and in no distress. Eyes: Conjunctivae are normal. Normal extraocular movements. ENT   Head: Normocephalic, there is a posterior scalp contusion, there is a laceration lateral to the left eyebrow that is superficial and 2 cm   Nose: No congestion/rhinnorhea.   Mouth/Throat: Mucous membranes are moist.   Neck: No stridor.  No carotid bruits are noted Cardiovascular: Normal rate, regular  rhythm. No murmurs, rubs, or gallops. Respiratory: Normal respiratory effort without tachypnea nor retractions. Breath sounds are clear and equal bilaterally. No wheezes/rales/rhonchi. Gastrointestinal: Soft and nontender. Normal bowel sounds Musculoskeletal: Pain and swelling noted over the left shoulder particularly the left distal clavicle.  Pain with range of motion of left shoulder Neurologic:  Normal speech and language. No gross focal neurologic deficits are appreciated.  Skin: Left eyebrow laceration Psychiatric: Mood and affect are normal. Speech and behavior are normal.  ____________________________________________  EKG: Interpreted by me.  Sinus rhythm rate 74 bpm, short PR interval, normal QRS, normal QT, normal axis  ____________________________________________  ED COURSE:  As part of my medical decision making, I reviewed the following data within the [place] History obtained from family if available, nursing notes, old chart and ekg, as well as notes from prior ED visits. Patient presented for syncope with head injury and left shoulder injury with facial laceration, we will assess with labs and imaging as indicated at this time.   Marland Kitchen.Laceration Repair Date/Time: 03/19/2018 7:26 AM Performed by: Earleen Newport, MD Authorized by: Earleen Newport, MD   Consent:    Consent obtained:  Verbal   Consent given by:  Patient Anesthesia (see MAR for exact dosages):    Anesthesia method:  None Laceration details:    Location:  Face   Face location:  L eyebrow   Length (cm):  2   Depth (mm):  3 Repair type:    Repair type:  Simple Treatment:    Irrigation solution:  Tap water   Visualized foreign bodies/material removed: no   Skin repair:    Repair method:  Tissue adhesive Approximation:    Approximation:  Close Post-procedure details:    Dressing:  Open (no dressing)   Patient tolerance of procedure:  Tolerated well, no immediate  complications   ____________________________________________   LABS (pertinent positives/negatives)  Labs Reviewed  COMPREHENSIVE METABOLIC PANEL - Abnormal; Notable for the following components:      Result Value   Sodium 132 (*)    Chloride 97 (*)    Glucose, Bld 126 (*)    All other components within normal limits  FIBRIN DERIVATIVES D-DIMER ([facility] ONLY) - Abnormal; Notable for the following components:   Fibrin derivatives D-dimer ([facility]) 505.28 (*)    All other components within normal limits  CBC WITH DIFFERENTIAL/PLATELET  TROPONIN I  URINALYSIS, COMPLETE ([facility]) WITH MICROSCOPIC    RADIOLOGY Images were viewed by me  CT head, left shoulder x-rays IMPRESSION: Gray-white compartments appear normal. No mass, hemorrhage, or extra-axial fluid.  There are foci of arterial vascular calcification. There is mucosal thickening in several ethmoid air cells. IMPRESSION: 1. No demonstrable pulmonary embolus. No thoracic aortic aneurysm or dissection. There is aortic atherosclerosis.  2. Areas of atelectatic change in the lower lung zones, slightly more on the left than on the right. No consolidation. No pleural effusion.  3.  No thoracic  adenopathy by size criteria.  Aortic Atherosclerosis (ICD10-I70.0). ____________________________________________  DIFFERENTIAL DIAGNOSIS   Orthostatic hypotension, dehydration, electrolyte abnormality, occult infection, head injury, clavicle fracture, humerus fracture, arrhythmia, PE, MI  FINAL ASSESSMENT AND PLAN  Syncope, head injury, laceration, clavicle fracture   Plan: The patient had presented for 2 episodes of syncope this morning with no history of same. Patient's labs did not reveal any acute process. Patient's imaging also negative with the exception of a left clavicle fracture.  She will be placed in a shoulder sling.  Due to the fact that she had 2 episodes of unexplained syncope I will discuss with hospitalist for  admission in a monitored bed.  Patient is agreeable to plan at this time.   Laurence Aly, MD   Note: This note was generated in part or whole with voice recognition software. Voice recognition is usually quite accurate but there are transcription errors that can and very often do occur. I apologize for any typographical errors that were not detected and corrected.     Earleen Newport, MD 03/19/18 [phone]

## 2018-03-19 NOTE — H&P (Signed)
[place] [facility] [place] NAME: Jackie Oneal    MR#:  [id number]  DATE OF BIRTH:  1945-09-30  DATE OF ADMISSION:  03/19/2018  PRIMARY CARE PHYSICIAN: Pleas Koch, NP   REQUESTING/REFERRING PHYSICIAN:   CHIEF COMPLAINT:   Chief Complaint  Patient presents with  . Loss of Consciousness  . Chest Pain  . Shoulder Pain    HISTORY OF PRESENT ILLNESS: Jackie Oneal  is a 72 y.o. female with a known history per below presenting to the emergency room with acute indigestion/burning sensation in the chest started around 6:30 AM, patient got up and was ambulating got dizzy lost consciousness and fell, patient did hit her head on the face, got up to ambulate once again and lost consciousness once more, around event patient did have some acute confusion at that time, patient brought into the emergency room found to have small left temporal laceration no longer bleeding, left clavicle fracture on routine x-ray, CT chest done which was negative for PE, sodium 132, chloride 97, patient evaluated in the emergency room, husband at the bedside, in no apparent distress, patient is now being admitted for acute syncope with collapse, atypical chest pain, acute left clavicular fracture, hyponatremia/hypochloremia.  PAST MEDICAL HISTORY:   Past Medical History:  Diagnosis Date  . Arthritis   . Chicken pox   . Osteoporosis   . Vertigo     PAST SURGICAL HISTORY:  Past Surgical History:  Procedure Laterality Date  . CATARACT EXTRACTION W/PHACO Left 03/18/2017   Procedure: CATARACT EXTRACTION PHACO AND INTRAOCULAR LENS PLACEMENT (IOC);  Surgeon: Birder Robson, MD;  Location: [facility] ORS;  Service: Ophthalmology;  Laterality: Left;  [place] 01:23.9 AP% 20.9 CDE 17.58 Fluid Pack lot # [id number]  . CATARACT EXTRACTION W/PHACO Right 11/25/2017   Procedure: CATARACT EXTRACTION PHACO AND INTRAOCULAR LENS PLACEMENT (IOC);  Surgeon: Birder Robson, MD;  Location: [facility]  ORS;  Service: Ophthalmology;  Laterality: Right;  [place] 00:41.5 AP% 16.7 CDE 6.95 Fluid Pack Lot # [id number]  . EYE SURGERY Left 03/2016   retina    SOCIAL HISTORY:  Social History   Tobacco Use  . Smoking status: Never Smoker  . Smokeless tobacco: Never Used  Substance Use Topics  . Alcohol use: Yes    Alcohol/week: 0.0 standard drinks    Comment: social    FAMILY HISTORY:  Family History  Problem Relation Age of Onset  . Arthritis Mother     DRUG ALLERGIES:  Allergies  Allergen Reactions  . Sulfa Antibiotics Other (See Comments)    Childhood allergy    REVIEW OF SYSTEMS:   CONSTITUTIONAL: No fever, fatigue or weakness.  EYES: No blurred or double vision.  EARS, NOSE, AND THROAT: No tinnitus or ear pain.  RESPIRATORY: No cough, shortness of breath, wheezing or hemoptysis.  CARDIOVASCULAR: + chest pain, no orthopnea, edema.  GASTROINTESTINAL: No nausea, vomiting, diarrhea or abdominal pain.  GENITOURINARY: No dysuria, hematuria.  ENDOCRINE: No polyuria, nocturia,  HEMATOLOGY: No anemia, easy bruising or bleeding SKIN: No rash or lesion. MUSCULOSKELETAL: No joint pain or arthritis.   NEUROLOGIC: No tingling, numbness, weakness. + Loss of consciousness, acute transient confusion PSYCHIATRY: No anxiety or depression.   MEDICATIONS AT HOME:  Prior to Admission medications   Medication Sig Start Date End Date Taking? Authorizing Provider  lactase (LACTAID) 3000 units tablet Take 3,000 Units by mouth daily as needed (for lactose intolerant).    Yes [provider]  naproxen sodium (ALEVE) 220 MG  tablet Take 220 mg by mouth daily as needed (for pain or headache).   Yes [provider]  Calcium Carbonate-Vitamin D (CALCIUM-VITAMIN D3 PO) Take 1 tablet by mouth daily.    [provider]      PHYSICAL EXAMINATION:   VITAL SIGNS: Blood pressure 111/64, pulse 80, resp. rate 15, height 5\' 1"  (1.549 m), weight 49.9 kg, SpO2 100 %.  GENERAL:  72  y.o.-year-old patient lying in the bed with no acute distress.  Thin appearing EYES: Pupils equal, round, reactive to light and accommodation. No scleral icterus. Extraocular muscles intact.  HEENT: Head atraumatic, normocephalic. Oropharynx and nasopharynx clear.  NECK:  Supple, no jugular venous distention. No thyroid enlargement, no tenderness.  LUNGS: Normal breath sounds bilaterally, no wheezing, rales,rhonchi or crepitation. No use of accessory muscles of respiration.  CARDIOVASCULAR: S1, S2 normal. No murmurs, rubs, or gallops.  ABDOMEN: Soft, nontender, nondistended. Bowel sounds present. No organomegaly or mass.  EXTREMITIES: No pedal edema, cyanosis, or clubbing.  Decreased range of motion of left upper extremity due to fracture NEUROLOGIC: Cranial nerves II through XII are intact. MAES. gait not checked.  PSYCHIATRIC: The patient is alert and oriented x 3.  SKIN: No obvious rash, lesion, or ulcer.   LABORATORY PANEL:   CBC Recent Labs  Lab 03/19/18 0719  WBC 7.4  HGB 12.5  HCT 35.8  PLT 181  MCV 91.3  MCH 31.9  MCHC 35.0  RDW 13.4  LYMPHSABS 1.4  MONOABS 0.6  EOSABS 0.1  BASOSABS 0.0   ------------------------------------------------------------------------------------------------------------------  Chemistries  Recent Labs  Lab 03/19/18 0719  NA 132*  K 3.5  CL 97*  CO2 25  GLUCOSE 126*  BUN 8  CREATININE 0.75  CALCIUM 9.0  AST 21  ALT 16  ALKPHOS 59  BILITOT 1.0   ------------------------------------------------------------------------------------------------------------------ estimated creatinine clearance is 49.4 mL/min (by C-G formula based on SCr of 0.75 mg/dL). ------------------------------------------------------------------------------------------------------------------ No results for input(s): TSH, T4TOTAL, T3FREE, THYROIDAB in the last 72 hours.  Invalid input(s): FREET3   Coagulation profile No results for input(s): INR, PROTIME in  the last 168 hours. ------------------------------------------------------------------------------------------------------------------- No results for input(s): DDIMER in the last 72 hours. -------------------------------------------------------------------------------------------------------------------  Cardiac Enzymes Recent Labs  Lab 03/19/18 0719  TROPONINI <0.03   ------------------------------------------------------------------------------------------------------------------ Invalid input(s): POCBNP  ---------------------------------------------------------------------------------------------------------------  Urinalysis No results found for: COLORURINE, APPEARANCEUR, LABSPEC, PHURINE, GLUCOSEU, HGBUR, BILIRUBINUR, KETONESUR, PROTEINUR, UROBILINOGEN, NITRITE, LEUKOCYTESUR   RADIOLOGY: Ct Head Wo Contrast  Result Date: 03/19/2018 CLINICAL DATA:  Syncope with dizziness and fall EXAM: CT HEAD WITHOUT CONTRAST TECHNIQUE: Contiguous axial images were obtained from the base of the skull through the vertex without intravenous contrast. COMPARISON:  None. FINDINGS: Brain: There is age related volume loss. There is no intracranial mass, hemorrhage, extra-axial fluid collection, or midline shift. Gray-white compartments appear normal. No acute infarct evident. Vascular: No hyperdense vessel. There is calcification in each carotid siphon region. Skull: The bony calvarium appears intact. Sinuses/Orbits: There is mucosal thickening in several ethmoid air cells. Other visualized paranasal sinuses are clear. Visualized orbits appear symmetric bilaterally. Other: Visualized mastoid air cells are clear. IMPRESSION: Gray-white compartments appear normal. No mass, hemorrhage, or extra-axial fluid. There are foci of arterial vascular calcification. There is mucosal thickening in several ethmoid air cells. Electronically Signed   By: Lowella Grip III M.D.   On: 03/19/2018 08:06   Ct Angio Chest Pe  W And/or Wo Contrast  Result Date: 03/19/2018 CLINICAL DATA:  Chest pain and dizziness EXAM: CT ANGIOGRAPHY CHEST WITH  CONTRAST TECHNIQUE: Multidetector CT imaging of the chest was performed using the standard protocol during bolus administration of intravenous contrast. Multiplanar CT image reconstructions and MIPs were obtained to evaluate the vascular anatomy. CONTRAST:  24mL ISOVUE-370 IOPAMIDOL (ISOVUE-370) INJECTION 76% COMPARISON:  None. FINDINGS: Cardiovascular: There is no demonstrable pulmonary embolus. There is no thoracic aortic aneurysm or dissection. Visualized great vessels appear normal. There is atherosclerosis in the thoracic aorta. There is no appreciable pericardial effusion or pericardial thickening. Mediastinum/Nodes: Thyroid appears normal. There are subcentimeter mediastinal lymph nodes. There is no adenopathy in the thoracic region by size criteria. No esophageal lesions are appreciable. Lungs/Pleura: There is lower lobe atelectatic change bilaterally, slightly more on the left than on the right. There is no frank consolidation. There is slight lower lobe bronchiectatic change. No pleural effusion or pleural thickening evident. Upper Abdomen: Visualized upper abdominal structures appear unremarkable. Musculoskeletal: There are no blastic or lytic bone lesions. No chest wall lesions are evident. Review of the MIP images confirms the above findings. IMPRESSION: 1. No demonstrable pulmonary embolus. No thoracic aortic aneurysm or dissection. There is aortic atherosclerosis. 2. Areas of atelectatic change in the lower lung zones, slightly more on the left than on the right. No consolidation. No pleural effusion. 3.  No thoracic adenopathy by size criteria. Aortic Atherosclerosis (ICD10-I70.0). Electronically Signed   By: Lowella Grip III M.D.   On: 03/19/2018 09:00   Dg Shoulder Left  Addendum Date: 03/19/2018   ADDENDUM REPORT: 03/19/2018 09:46 ADDENDUM: Also noted is a minimally  displaced mid left clavicle fracture. Electronically Signed   By: Rolm Baptise M.D.   On: 03/19/2018 09:46   Result Date: 03/19/2018 CLINICAL DATA:  Syncope, left shoulder pain EXAM: LEFT SHOULDER - 2+ VIEW COMPARISON:  None. FINDINGS: Early degenerative changes in the left [facility] joint with early joint space narrowing and spurring. Glenohumeral joint is maintained. No acute bony abnormality. Specifically, no fracture, subluxation, or dislocation. IMPRESSION: Early left AC joint degenerative changes. No acute bony abnormality. Electronically Signed: By: Rolm Baptise M.D. On: 03/19/2018 08:04    EKG: Orders placed or performed during the hospital encounter of 03/19/18  . EKG 12-Lead  . EKG 12-Lead    IMPRESSION AND PLAN: *Acute syncopal episode with collapse *Acute atypical chest pain *Acute left clavicular fracture secondary to fall *Acute hyponatremia/hypochloremia  Admit to regular nursing floor bed on telemetry, rule out acute coronary syndrome with cardiac enzymes x3 sets, cycle set of cardiac enzymes, nitrates as needed, adult pain protocol, supplemental oxygen PRN, aspirin, check carotid Dopplers, echocardiogram, orthopedic surgery to see regarding left clavicular fracture, check orthostatics daily, IV fluids for rehydration, BMP in the morning, physical therapy to evaluate/treat, and continue close medical monitoring  All the records are reviewed and case discussed with ED provider. Management plans discussed with the patient, family and they are in agreement.  CODE STATUS:full    TOTAL TIME TAKING CARE OF THIS PATIENT: 45 minutes.    Avel Peace Salary M.D on 03/19/2018   Between 7am to 6pm - Pager - [phone]  After 6pm go to [email or web address] - password [email or web address] [place] [facility]  Office  [phone]  CC: Primary care physician; Pleas Koch, NP   Note: This dictation was prepared with Dragon dictation along with smaller phrase technology. Any  transcriptional errors that result from this process are unintentional.

## 2018-03-19 NOTE — Plan of Care (Signed)
  Problem: Education: Goal: Knowledge of General Education information will improve Description Including pain rating scale, medication(s)/side effects and non-pharmacologic comfort measures Outcome: Progressing   Problem: Health Behavior/Discharge Planning: Goal: Ability to manage health-related needs will improve Outcome: Progressing   Problem: Clinical Measurements: Goal: Ability to maintain clinical measurements within normal limits will improve Outcome: Progressing Note:  Pt came in with  syncopy  after having fainted at home.  A/o.  No further syncopy at present   Problem: Activity: Goal: Risk for activity intolerance will decrease Outcome: Progressing   Problem: Nutrition: Goal: Adequate nutrition will be maintained Outcome: Progressing   Problem: Pain Managment: Goal: General experience of comfort will improve Outcome: Progressing   Problem: Safety: Goal: Ability to remain free from injury will improve Outcome: Progressing

## 2018-03-19 NOTE — Progress Notes (Signed)
Family Meeting Note  Advance Directive:yes  Today a meeting took place with the Patient.  Patient is able to participate   The following clinical team members were present during this meeting:MD  The following were discussed:Patient's diagnosis: Syncope with collapse x2, loss of consciousness, acute confusion, clavicular fracture, chest pain, electrolyte abnormalities, Patient's progosis: Unable to determine and Goals for treatment: Full Code  Additional follow-up to be provided: prn  Time spent during discussion: 20 minutes  Gorden Harms, MD

## 2018-03-19 NOTE — Evaluation (Signed)
Physical Therapy Evaluation Patient Details Name: Jackie Oneal MRN: [id number] DOB: [birth year] Today's Date: 03/19/2018   History of Present Illness  Pt is a 72 y.o. female with a known history of arthritis, vertigo, and osteoporosis presented to the emergency room with acute indigestion/burning sensation in the chest.  Patient got up and was ambulating got dizzy lost consciousness and fell, patient did hit her head on the face, got up to ambulate once again and lost consciousness once more, around event patient did have some acute confusion at that time, patient brought into the emergency room found to have small left temporal laceration no longer bleeding, left clavicle fracture on routine x-ray, CT chest done which was negative for PE, sodium 132, chloride 97, patient evaluated in the emergency room in no apparent distress, patient now admitted for acute syncope with collapse, atypical chest pain, acute left clavicular fracture, hyponatremia/hypochloremia.    Clinical Impression  Pt presents with no deficits in strength, transfers, mobility, gait, balance, or activity tolerance with LUE not tested secondary to acute clavicle fracture.  Pt's orthostatic BPs taken as follows all being asymptomatic:  Supine 115/57 with HR 81 bpm, Sitting 112/60 with HR 86 bpm, Standing 124/59 with HR 91 bpm.  Pt was Ind with all functional tasks with good speed, effort, and control including amb 400' without an AD with no instability or adverse symptoms noted.  Will complete PT orders at this time but will reassess pt pending a change in status upon receipt of new PT orders.       Follow Up Recommendations No PT follow up;Supervision for mobility/OOB;Other (comment)(Education provided to spouse regarding supervision with amb/OOB activities secondary to pt's recent idiopathic LOC durig ambulation)    Equipment Recommendations  None recommended by PT    Recommendations for Other Services       Precautions /  Restrictions Precautions Precautions: Fall Required Braces or Orthoses: Sling Restrictions Weight Bearing Restrictions: Yes LUE Weight Bearing: Non weight bearing Other Position/Activity Restrictions: Sling to LUE      Mobility  Bed Mobility Overal bed mobility: Independent                Transfers Overall transfer level: Independent                  Ambulation/Gait Ambulation/Gait assistance: Independent Gait Distance (Feet): 400 Feet Assistive device: None Gait Pattern/deviations: WFL(Within Functional Limits)   Gait velocity interpretation: >2.62 ft/sec, indicative of community ambulatory General Gait Details: Good cadence and stability during amb without an AD  Stairs            Wheelchair Mobility    Modified Rankin (Stroke Patients Only)       Balance Overall balance assessment: No apparent balance deficits (not formally assessed)                                           Pertinent Vitals/Pain Pain Assessment: 0-10 Pain Score: 4  Pain Location: L clavical  Pain Descriptors / Indicators: Sore Pain Intervention(s): Premedicated before session;Monitored during session    Home Living Family/patient expects to be discharged to:: Private residence Living Arrangements: Spouse/significant other Available Help at Discharge: Family;Available 24 hours/day Type of Home: House Home Access: Stairs to enter Entrance Stairs-Rails: None Entrance Stairs-Number of Steps: 1 Home Layout: Two level;Able to live on main level with bedroom/bathroom Home Equipment: None  Prior Function Level of Independence: Independent         Comments: Pt Ind with amb without AD, walks 2-3 miles at least 3x/wk, no fall history other than current falls due to LOC, Ind with ADLs     Hand Dominance        Extremity/Trunk Assessment   Upper Extremity Assessment Upper Extremity Assessment: LUE deficits/detail LUE: Unable to fully assess  due to immobilization    Lower Extremity Assessment Lower Extremity Assessment: Overall WFL for tasks assessed       Communication   Communication: No difficulties  Cognition Arousal/Alertness: Awake/alert Behavior During Therapy: WFL for tasks assessed/performed Overall Cognitive Status: Within Functional Limits for tasks assessed                                        General Comments      Exercises     Assessment/Plan    PT Assessment Patent does not need any further PT services  PT Problem List         PT Treatment Interventions      PT Goals (Current goals can be found in the Care Plan section)  Acute Rehab PT Goals PT Goal Formulation: All assessment and education complete, DC therapy    Frequency     Barriers to discharge        Co-evaluation               AM-PAC PT "6 Clicks" Daily Activity  Outcome Measure Difficulty turning over in bed (including adjusting bedclothes, sheets and blankets)?: None Difficulty moving from lying on back to sitting on the side of the bed? : None Difficulty sitting down on and standing up from a chair with arms (e.g., wheelchair, bedside commode, etc,.)?: None Help needed moving to and from a bed to chair (including a wheelchair)?: None Help needed walking in hospital room?: None Help needed climbing 3-5 steps with a railing? : None 6 Click Score: 24    End of Session Equipment Utilized During Treatment: Gait belt Activity Tolerance: Patient tolerated treatment well Patient left: in chair;with call bell/phone within reach;with chair alarm set;with family/visitor present Nurse Communication: Mobility status PT Visit Diagnosis: Muscle weakness (generalized) (M62.81)    Time: 1450-1535 PT Time Calculation (min) (ACUTE ONLY): 45 min   Charges:   PT Evaluation $PT Eval Low Complexity: 1 Low          D. Royetta Asal PT, DPT 03/19/18, 3:55 PM

## 2018-03-19 NOTE — ED Triage Notes (Signed)
Pt here from home with c/o midsternal "burning" cp that began around 0630, pt got up to get water, felt dizzy and passed out, hit her head on glass vase, got up to pick up the vase and passed out again, hitting the back of her head on the floor, knot noted to back of head. Small lac to left temple noted with dried blood, from first fall per pt. Pt denies blood thinners, has had a cough for the past few days, denies any cp at this time, states left shoulder/clavical pain from fall. Appears pale.

## 2018-03-19 NOTE — Consult Note (Signed)
ORTHOPAEDIC CONSULTATION  PATIENT NAME: Jackie Oneal DOB: 02/20/1946  MRN: [id number]  REQUESTING PHYSICIAN: Vaughan Basta, *  Chief Complaint: Left shoulder pain  HPI: Jackie Oneal is a 72 y.o. right-hand-dominant female who complains of left shoulder pain.  She arose this morning and became dizzy.  She apparently lost consciousness and fell.  Upon arising to ambulate she again lost consciousness and fell.  She apparently sustained a laceration to the left temporal area and complained of left shoulder pain.  Radiographs obtained in the Emergency Department demonstrated a nondisplaced distal clavicle fracture.  Past Medical History:  Diagnosis Date  . Arthritis   . Chicken pox   . Osteoporosis   . Vertigo    Past Surgical History:  Procedure Laterality Date  . CATARACT EXTRACTION W/PHACO Left 03/18/2017   Procedure: CATARACT EXTRACTION PHACO AND INTRAOCULAR LENS PLACEMENT (IOC);  Surgeon: Birder Robson, MD;  Location: [facility] ORS;  Service: Ophthalmology;  Laterality: Left;  [place] 01:23.9 AP% 20.9 CDE 17.58 Fluid Pack lot # [id number]  . CATARACT EXTRACTION W/PHACO Right 11/25/2017   Procedure: CATARACT EXTRACTION PHACO AND INTRAOCULAR LENS PLACEMENT (IOC);  Surgeon: Birder Robson, MD;  Location: [facility] ORS;  Service: Ophthalmology;  Laterality: Right;  [place] 00:41.5 AP% 16.7 CDE 6.95 Fluid Pack Lot # [id number]  . EYE SURGERY Left 03/2016   retina   Social History   Socioeconomic History  . Marital status: Married    Spouse name: Not on file  . Number of children: Not on file  . Years of education: Not on file  . Highest education level: Not on file  Occupational History  . Not on file  Social Needs  . Financial resource strain: Not on file  . Food insecurity:    Worry: Not on file    Inability: Not on file  . Transportation needs:    Medical: Not on file    Non-medical: Not on file  Tobacco Use  . Smoking status: Never Smoker  . Smokeless tobacco: Never  Used  Substance and Sexual Activity  . Alcohol use: Yes    Alcohol/week: 0.0 standard drinks    Comment: social  . Drug use: No  . Sexual activity: Yes  Lifestyle  . Physical activity:    Days per week: Not on file    Minutes per session: Not on file  . Stress: Not on file  Relationships  . Social connections:    Talks on phone: Not on file    Gets together: Not on file    Attends religious service: Not on file    Active member of club or organization: Not on file    Attends meetings of clubs or organizations: Not on file    Relationship status: Not on file  Other Topics Concern  . Not on file  Social History Narrative   Married.   2 sons. 3 grandchildren.   Retired. Worked as a Water quality scientist.    Enjoys walking, decorating, reading, sewing.   Family History  Problem Relation Age of Onset  . Arthritis Mother    Allergies  Allergen Reactions  . Sulfa Antibiotics Other (See Comments)    Childhood allergy   Prior to Admission medications   Medication Sig Start Date End Date Taking? Authorizing Provider  lactase (LACTAID) 3000 units tablet Take 3,000 Units by mouth daily as needed (for lactose intolerant).    Yes [provider]  naproxen sodium (ALEVE) 220 MG tablet Take 220 mg by mouth daily as needed (for pain or  headache).   Yes [provider]  Calcium Carbonate-Vitamin D (CALCIUM-VITAMIN D3 PO) Take 1 tablet by mouth daily.    [provider]   Ct Head Wo Contrast  Result Date: 03/19/2018 CLINICAL DATA:  Syncope with dizziness and fall EXAM: CT HEAD WITHOUT CONTRAST TECHNIQUE: Contiguous axial images were obtained from the base of the skull through the vertex without intravenous contrast. COMPARISON:  None. FINDINGS: Brain: There is age related volume loss. There is no intracranial mass, hemorrhage, extra-axial fluid collection, or midline shift. Gray-white compartments appear normal. No acute infarct evident. Vascular: No hyperdense vessel.  There is calcification in each carotid siphon region. Skull: The bony calvarium appears intact. Sinuses/Orbits: There is mucosal thickening in several ethmoid air cells. Other visualized paranasal sinuses are clear. Visualized orbits appear symmetric bilaterally. Other: Visualized mastoid air cells are clear. IMPRESSION: Gray-white compartments appear normal. No mass, hemorrhage, or extra-axial fluid. There are foci of arterial vascular calcification. There is mucosal thickening in several ethmoid air cells. Electronically Signed   By: Lowella Grip III M.D.   On: 03/19/2018 08:06   Ct Angio Chest Pe W And/or Wo Contrast  Result Date: 03/19/2018 CLINICAL DATA:  Chest pain and dizziness EXAM: CT ANGIOGRAPHY CHEST WITH CONTRAST TECHNIQUE: Multidetector CT imaging of the chest was performed using the standard protocol during bolus administration of intravenous contrast. Multiplanar CT image reconstructions and MIPs were obtained to evaluate the vascular anatomy. CONTRAST:  6mL ISOVUE-370 IOPAMIDOL (ISOVUE-370) INJECTION 76% COMPARISON:  None. FINDINGS: Cardiovascular: There is no demonstrable pulmonary embolus. There is no thoracic aortic aneurysm or dissection. Visualized great vessels appear normal. There is atherosclerosis in the thoracic aorta. There is no appreciable pericardial effusion or pericardial thickening. Mediastinum/Nodes: Thyroid appears normal. There are subcentimeter mediastinal lymph nodes. There is no adenopathy in the thoracic region by size criteria. No esophageal lesions are appreciable. Lungs/Pleura: There is lower lobe atelectatic change bilaterally, slightly more on the left than on the right. There is no frank consolidation. There is slight lower lobe bronchiectatic change. No pleural effusion or pleural thickening evident. Upper Abdomen: Visualized upper abdominal structures appear unremarkable. Musculoskeletal: There are no blastic or lytic bone lesions. No chest wall lesions are  evident. Review of the MIP images confirms the above findings. IMPRESSION: 1. No demonstrable pulmonary embolus. No thoracic aortic aneurysm or dissection. There is aortic atherosclerosis. 2. Areas of atelectatic change in the lower lung zones, slightly more on the left than on the right. No consolidation. No pleural effusion. 3.  No thoracic adenopathy by size criteria. Aortic Atherosclerosis (ICD10-I70.0). Electronically Signed   By: Lowella Grip III M.D.   On: 03/19/2018 09:00   Dg Shoulder Left  Addendum Date: 03/19/2018   ADDENDUM REPORT: 03/19/2018 09:46 ADDENDUM: Also noted is a minimally displaced mid left clavicle fracture. Electronically Signed   By: Rolm Baptise M.D.   On: 03/19/2018 09:46   Result Date: 03/19/2018 CLINICAL DATA:  Syncope, left shoulder pain EXAM: LEFT SHOULDER - 2+ VIEW COMPARISON:  None. FINDINGS: Early degenerative changes in the left [facility] joint with early joint space narrowing and spurring. Glenohumeral joint is maintained. No acute bony abnormality. Specifically, no fracture, subluxation, or dislocation. IMPRESSION: Early left AC joint degenerative changes. No acute bony abnormality. Electronically Signed: By: Rolm Baptise M.D. On: 03/19/2018 08:04    Positive ROS: All other systems have been reviewed and were otherwise negative with the exception of those mentioned in the HPI and as above.  Physical Exam: General: Well developed,  well nourished female seen in no acute distress. Skin: Early ecchymosis over the distal left clavicle Neurologic: Awake, alert, and oriented. Sensory function is grossly intact. Motor strength is felt to be 5 over 5 bilaterally with the exception of the left upper extremity that was not assessed secondary to the injury. No clonus or tremor. Good motor coordination.  MUSCULOSKELETAL: Examination of the left upper extremity shows a sling to be in place.  There is tenderness to palpation over the distal clavicle.  The skin is intact with  early ecchymosis as noted above.  No tenderness to palpation along the subacromial region.  No gross deformity.  Assessment: Nondisplaced left distal clavicle fracture  Plan: The findings were discussed in detail with the patient and her husband.  Recommendation was made for continuation of the sling for comfort.  She may use ice to the area as needed to control swelling.  She is to follow-up in the office in 2 to 3 weeks for reevaluation.  Winton Offord P. Holley Bouche M.D.

## 2018-03-20 DIAGNOSIS — S42009A Fracture of unspecified part of unspecified clavicle, initial encounter for closed fracture: Secondary | ICD-10-CM | POA: Diagnosis not present

## 2018-03-20 DIAGNOSIS — R55 Syncope and collapse: Secondary | ICD-10-CM | POA: Diagnosis not present

## 2018-03-20 DIAGNOSIS — R079 Chest pain, unspecified: Secondary | ICD-10-CM | POA: Diagnosis not present

## 2018-03-20 DIAGNOSIS — E871 Hypo-osmolality and hyponatremia: Secondary | ICD-10-CM | POA: Diagnosis not present

## 2018-03-20 LAB — BASIC METABOLIC PANEL
Anion gap: 5 (ref 5–15)
BUN: 10 mg/dL (ref 8–23)
CO2: 24 mmol/L (ref 22–32)
Calcium: 8.2 mg/dL — ABNORMAL LOW (ref 8.9–10.3)
Chloride: 108 mmol/L (ref 98–111)
Creatinine, Ser: 0.63 mg/dL (ref 0.44–1.00)
GFR calc Af Amer: >60 mL/min (ref >60)
GLUCOSE: 97 mg/dL (ref 70–99)
Potassium: 3.8 mmol/L (ref 3.5–5.1)
SODIUM: 137 mmol/L (ref 135–145)

## 2018-03-20 LAB — LIPID PANEL
Cholesterol: 176 mg/dL (ref 0–200)
HDL: 48 mg/dL (ref >40)
LDL CALC: 120 mg/dL — AB (ref 0–99)
TRIGLYCERIDES: 42 mg/dL (ref <150)
Total CHOL/HDL Ratio: 3.7 RATIO
VLDL: 8 mg/dL (ref 0–40)

## 2018-03-20 LAB — URINALYSIS, COMPLETE (UACMP) WITH MICROSCOPIC
Bacteria, UA: NONE SEEN
Bilirubin Urine: NEGATIVE
Glucose, UA: NEGATIVE mg/dL
Ketones, ur: NEGATIVE mg/dL
NITRITE: NEGATIVE
PH: 6 (ref 5.0–8.0)
Protein, ur: NEGATIVE mg/dL
SPECIFIC GRAVITY, URINE: 1.012 (ref 1.005–1.030)

## 2018-03-20 LAB — ECHOCARDIOGRAM COMPLETE
HEIGHTINCHES: 61 in
WEIGHTICAEL: 1760 [oz_av]

## 2018-03-20 LAB — HIV ANTIBODY (ROUTINE TESTING W REFLEX): HIV Screen 4th Generation wRfx: NONREACTIVE

## 2018-03-20 MED ORDER — ACETAMINOPHEN 325 MG PO TABS: 650.0000 mg | ORAL_TABLET | ORAL | 0 refills | Status: AC | PRN

## 2018-03-20 MED ORDER — PANTOPRAZOLE SODIUM 40 MG PO TBEC: 40.0000 mg | DELAYED_RELEASE_TABLET | Freq: Every day | ORAL | Status: DC

## 2018-03-20 MED ORDER — PANTOPRAZOLE SODIUM 40 MG PO TBEC: 40.0000 mg | DELAYED_RELEASE_TABLET | Freq: Every day | ORAL | 0 refills | Status: DC

## 2018-03-20 NOTE — Progress Notes (Signed)
Patient discharged home per MD order. Prescription given to patient. All discharge instructions given and all questions answered. 

## 2018-03-20 NOTE — Discharge Summary (Signed)
[facility] NAME: Jackie Oneal    MR#:  [id number]  DATE OF BIRTH:  02-19-1946  DATE OF ADMISSION:  03/19/2018 ADMITTING PHYSICIAN: Gorden Harms, MD  DATE OF DISCHARGE: 03/20/2018   PRIMARY CARE PHYSICIAN: Pleas Koch, NP    ADMISSION DIAGNOSIS:  Syncope and collapse [R55] Facial laceration, initial encounter [S01.81XA] Injury of head, initial encounter [S09.90XA] Closed nondisplaced fracture of clavicle, unspecified laterality, unspecified part of clavicle, initial encounter [S42.009A]  DISCHARGE DIAGNOSIS:  Active Problems:   Syncope and collapse   Orthostatic hypotension  SECONDARY DIAGNOSIS:   Past Medical History:  Diagnosis Date  . Arthritis   . Chicken pox   . Osteoporosis   . Vertigo     HOSPITAL COURSE:   *Acute syncopal episode with collapse- orthostatic hypotension *Acute atypical chest pain- resolved *Acute left clavicular fracture secondary to fall- seen by ortho- given a sling.    Her pain is under control. I suggested to have percocet PRN after discharge, but she and her husband said" She don't want those strong meds anyways, and she will be fine with tylenol and ibuprophen" I have advised to take NO more than 2 ibuprofen in 1 day and not more than next 5-6 days. Take Protonix daily.  *Acute hyponatremia/hypochloremia  Her infection work ups, Carotid doppler, Echo and CT head are negative. Advised about waiting for a few seconds after standing and wearing an elastic stockings.  DISCHARGE CONDITIONS:   Stable.  CONSULTS OBTAINED:  Treatment Team:  Dereck Leep, MD  DRUG ALLERGIES:   Allergies  Allergen Reactions  . Sulfa Antibiotics Other (See Comments)    Childhood allergy    DISCHARGE MEDICATIONS:   Allergies as of 03/20/2018      Reactions   Sulfa Antibiotics Other (See Comments)   Childhood allergy      Medication List    TAKE these medications    acetaminophen 325 MG tablet Commonly known as:  TYLENOL Take 2 tablets (650 mg total) by mouth every 4 (four) hours as needed for headache or mild pain.   CALCIUM-VITAMIN D3 PO Take 1 tablet by mouth daily.   LACTAID 3000 units tablet Generic drug:  lactase Take 3,000 Units by mouth daily as needed (for lactose intolerant).   naproxen sodium 220 MG tablet Commonly known as:  ALEVE Take 220 mg by mouth daily as needed (for pain or headache).   pantoprazole 40 MG tablet Commonly known as:  PROTONIX Take 1 tablet (40 mg total) by mouth daily.        DISCHARGE INSTRUCTIONS:    Follow in ortho clinic in 2 weeks.  If you experience worsening of your admission symptoms, develop shortness of breath, life threatening emergency, suicidal or homicidal thoughts you must seek medical attention immediately by calling 911 or calling your MD immediately  if symptoms less severe.  You Must read complete instructions/literature along with all the possible adverse reactions/side effects for all the Medicines you take and that have been prescribed to you. Take any new Medicines after you have completely understood and accept all the possible adverse reactions/side effects.   Please note  You were cared for by a hospitalist during your hospital stay. If you have any questions about your discharge medications or the care you received while you were in the hospital after you are discharged, you can call the unit and asked to speak with the hospitalist on call if the hospitalist that  took care of you is not available. Once you are discharged, your primary care physician will handle any further medical issues. Please note that NO REFILLS for any discharge medications will be authorized once you are discharged, as it is imperative that you return to your primary care physician (or establish a relationship with a primary care physician if you do not have one) for your aftercare needs so that they can  reassess your need for medications and monitor your lab values.    Today   CHIEF COMPLAINT:   Chief Complaint  Patient presents with  . Loss of Consciousness  . Chest Pain  . Shoulder Pain    HISTORY OF PRESENT ILLNESS:  Jackie Oneal  is a 72 y.o. female with a known history per below presenting to the emergency room with acute indigestion/burning sensation in the chest started around 6:30 AM, patient got up and was ambulating got dizzy lost consciousness and fell, patient did hit her head on the face, got up to ambulate once again and lost consciousness once more, around event patient did have some acute confusion at that time, patient brought into the emergency room found to have small left temporal laceration no longer bleeding, left clavicle fracture on routine x-ray, CT chest done which was negative for PE, sodium 132, chloride 97, patient evaluated in the emergency room, husband at the bedside, in no apparent distress, patient is now being admitted for acute syncope with collapse, atypical chest pain, acute left clavicular fracture, hyponatremia/hypochloremia.  VITAL SIGNS:  Blood pressure 114/61, pulse 87, temperature 99.4 F (37.4 C), temperature source Oral, resp. rate (!) 21, height 5\' 1"  (1.549 m), weight 49.9 kg, SpO2 95 %.  I/O:    Intake/Output Summary (Last 24 hours) at 03/20/2018 1209 Last data filed at 03/20/2018 0325 Gross per 24 hour  Intake 2161.9 ml  Output -  Net 2161.9 ml    PHYSICAL EXAMINATION:  GENERAL:  72 y.o.-year-old patient lying in the bed with no acute distress.  EYES: Pupils equal, round, reactive to light and accommodation. No scleral icterus. Extraocular muscles intact.  HEENT: Head atraumatic, normocephalic. Oropharynx and nasopharynx clear.  NECK:  Supple, no jugular venous distention. No thyroid enlargement, no tenderness.  LUNGS: Normal breath sounds bilaterally, no wheezing, rales,rhonchi or crepitation. No use of accessory muscles of  respiration.  CARDIOVASCULAR: S1, S2 normal. No murmurs, rubs, or gallops.  ABDOMEN: Soft, non-tender, non-distended. Bowel sounds present. No organomegaly or mass.  EXTREMITIES: No pedal edema, cyanosis, or clubbing. LUL in a sling. NEUROLOGIC: Cranial nerves II through XII are intact. Muscle strength 5/5 in all extremities. Sensation intact. Gait not checked.  PSYCHIATRIC: The patient is alert and oriented x 3.  SKIN: No obvious rash, lesion, or ulcer.   DATA REVIEW:   CBC Recent Labs  Lab 03/19/18 0719  WBC 7.4  HGB 12.5  HCT 35.8  PLT 181    Chemistries  Recent Labs  Lab 03/19/18 0719 03/20/18 0407  NA 132* 137  K 3.5 3.8  CL 97* 108  CO2 25 24  GLUCOSE 126* 97  BUN 8 10  CREATININE 0.75 0.63  CALCIUM 9.0 8.2*  AST 21  --   ALT 16  --   ALKPHOS 59  --   BILITOT 1.0  --     Cardiac Enzymes Recent Labs  Lab 03/19/18 1753  TROPONINI <0.03    Microbiology Results  No results found for this or any previous visit.  RADIOLOGY:  Ct Head Wo  Contrast  Result Date: 03/19/2018 CLINICAL DATA:  Syncope with dizziness and fall EXAM: CT HEAD WITHOUT CONTRAST TECHNIQUE: Contiguous axial images were obtained from the base of the skull through the vertex without intravenous contrast. COMPARISON:  None. FINDINGS: Brain: There is age related volume loss. There is no intracranial mass, hemorrhage, extra-axial fluid collection, or midline shift. Gray-white compartments appear normal. No acute infarct evident. Vascular: No hyperdense vessel. There is calcification in each carotid siphon region. Skull: The bony calvarium appears intact. Sinuses/Orbits: There is mucosal thickening in several ethmoid air cells. Other visualized paranasal sinuses are clear. Visualized orbits appear symmetric bilaterally. Other: Visualized mastoid air cells are clear. IMPRESSION: Gray-white compartments appear normal. No mass, hemorrhage, or extra-axial fluid. There are foci of arterial vascular  calcification. There is mucosal thickening in several ethmoid air cells. Electronically Signed   By: Lowella Grip III M.D.   On: 03/19/2018 08:06   Ct Angio Chest Pe W And/or Wo Contrast  Result Date: 03/19/2018 CLINICAL DATA:  Chest pain and dizziness EXAM: CT ANGIOGRAPHY CHEST WITH CONTRAST TECHNIQUE: Multidetector CT imaging of the chest was performed using the standard protocol during bolus administration of intravenous contrast. Multiplanar CT image reconstructions and MIPs were obtained to evaluate the vascular anatomy. CONTRAST:  42mL ISOVUE-370 IOPAMIDOL (ISOVUE-370) INJECTION 76% COMPARISON:  None. FINDINGS: Cardiovascular: There is no demonstrable pulmonary embolus. There is no thoracic aortic aneurysm or dissection. Visualized great vessels appear normal. There is atherosclerosis in the thoracic aorta. There is no appreciable pericardial effusion or pericardial thickening. Mediastinum/Nodes: Thyroid appears normal. There are subcentimeter mediastinal lymph nodes. There is no adenopathy in the thoracic region by size criteria. No esophageal lesions are appreciable. Lungs/Pleura: There is lower lobe atelectatic change bilaterally, slightly more on the left than on the right. There is no frank consolidation. There is slight lower lobe bronchiectatic change. No pleural effusion or pleural thickening evident. Upper Abdomen: Visualized upper abdominal structures appear unremarkable. Musculoskeletal: There are no blastic or lytic bone lesions. No chest wall lesions are evident. Review of the MIP images confirms the above findings. IMPRESSION: 1. No demonstrable pulmonary embolus. No thoracic aortic aneurysm or dissection. There is aortic atherosclerosis. 2. Areas of atelectatic change in the lower lung zones, slightly more on the left than on the right. No consolidation. No pleural effusion. 3.  No thoracic adenopathy by size criteria. Aortic Atherosclerosis (ICD10-I70.0). Electronically Signed   By:  Lowella Grip III M.D.   On: 03/19/2018 09:00   US Carotid Bilateral  Result Date: 03/20/2018 CLINICAL DATA:  Syncope and collapse EXAM: BILATERAL CAROTID DUPLEX ULTRASOUND TECHNIQUE: Pearline Cables scale imaging, color Doppler and duplex ultrasound were performed of bilateral carotid and vertebral arteries in the neck. COMPARISON:  None. FINDINGS: Criteria: Quantification of carotid stenosis is based on velocity parameters that correlate the residual internal carotid diameter with NASCET-based stenosis levels, using the diameter of the distal internal carotid lumen as the denominator for stenosis measurement. The following velocity measurements were obtained: RIGHT ICA: 117/23 cm/sec CCA: 536/46 cm/sec SYSTOLIC ICA/CCA RATIO: 1.1 ECA: 95 cm/sec LEFT ICA: 79/9 cm/sec CCA: 803/21 cm/sec SYSTOLIC ICA/CCA RATIO:  0.7 ECA: 130 cm/sec RIGHT CAROTID ARTERY: Minor echogenic shadowing plaque formation. No hemodynamically significant right ICA stenosis, velocity elevation, or turbulent flow. Degree of narrowing less than 50%. RIGHT VERTEBRAL ARTERY:  Antegrade LEFT CAROTID ARTERY: Similar scattered minor echogenic plaque formation. No hemodynamically significant left ICA stenosis, velocity elevation, or turbulent flow. LEFT VERTEBRAL ARTERY:  Antegrade IMPRESSION: Minor carotid atherosclerosis. No hemodynamically  significant ICA stenosis. Degree of narrowing less than 50% bilaterally by ultrasound criteria. Patent antegrade vertebral flow bilaterally Electronically Signed   By: Jerilynn Mages.  Shick M.D.   On: 03/20/2018 08:11   Dg Shoulder Left  Addendum Date: 03/19/2018   ADDENDUM REPORT: 03/19/2018 09:46 ADDENDUM: Also noted is a minimally displaced mid left clavicle fracture. Electronically Signed   By: Rolm Baptise M.D.   On: 03/19/2018 09:46   Result Date: 03/19/2018 CLINICAL DATA:  Syncope, left shoulder pain EXAM: LEFT SHOULDER - 2+ VIEW COMPARISON:  None. FINDINGS: Early degenerative changes in the left [facility] joint with early  joint space narrowing and spurring. Glenohumeral joint is maintained. No acute bony abnormality. Specifically, no fracture, subluxation, or dislocation. IMPRESSION: Early left AC joint degenerative changes. No acute bony abnormality. Electronically Signed: By: Rolm Baptise M.D. On: 03/19/2018 08:04    EKG:   Orders placed or performed during the hospital encounter of 03/19/18  . EKG 12-Lead  . EKG 12-Lead  . EKG      Management plans discussed with the patient, family and they are in agreement.  CODE STATUS: Full.    Code Status Orders  (From admission, onward)         Start     Ordered   03/19/18 1153  Full code  Continuous     03/19/18 1152        Code Status History    This patient has a current code status but no historical code status.    Advance Directive Documentation     Most Recent Value  Type of Advance Directive  Living will, Healthcare Power of Attorney  Pre-existing out of facility DNR order (yellow form or pink MOST form)  -  "MOST" Form in Place?  -      TOTAL TIME TAKING CARE OF THIS PATIENT: 35 minutes.    Vaughan Basta M.D on 03/20/2018 at 12:09 PM  Between 7am to 6pm - Pager - [phone]  After 6pm go to [email or web address] - password [email or web address] [facility]  Office  [phone]  CC: Primary care physician; Pleas Koch, NP   Note: This dictation was prepared with Dragon dictation along with smaller phrase technology. Any transcriptional errors that result from this process are unintentional.

## 2018-03-20 NOTE — Plan of Care (Signed)
Up with standby assist. Seen by Dr Marry Guan. No surgical intervention needed at this time. Left arm in sling. Pain worsens with activity. Morphine given with adequate relief. Problem: Education: Goal: Knowledge of General Education information will improve Description Including pain rating scale, medication(s)/side effects and non-pharmacologic comfort measures Outcome: Progressing   Problem: Health Behavior/Discharge Planning: Goal: Ability to manage health-related needs will improve Outcome: Progressing   Problem: Clinical Measurements: Goal: Ability to maintain clinical measurements within normal limits will improve Outcome: Progressing   Problem: Activity: Goal: Risk for activity intolerance will decrease Outcome: Progressing   Problem: Nutrition: Goal: Adequate nutrition will be maintained Outcome: Progressing   Problem: Pain Managment: Goal: General experience of comfort will improve Outcome: Progressing   Problem: Safety: Goal: Ability to remain free from injury will improve Outcome: Progressing

## 2018-03-20 NOTE — Discharge Instructions (Signed)
Follow with ortho clinic in 2-3 weeks.

## 2018-04-09 DIAGNOSIS — S42032A Displaced fracture of lateral end of left clavicle, initial encounter for closed fracture: Secondary | ICD-10-CM | POA: Diagnosis not present

## 2018-04-09 DIAGNOSIS — W010XXA Fall on same level from slipping, tripping and stumbling without subsequent striking against object, initial encounter: Secondary | ICD-10-CM | POA: Diagnosis not present

## 2018-04-09 DIAGNOSIS — M25512 Pain in left shoulder: Secondary | ICD-10-CM | POA: Diagnosis not present

## 2018-04-29 ENCOUNTER — Other Ambulatory Visit: Payer: Self-pay | Admitting: Primary Care

## 2018-04-29 DIAGNOSIS — E559 Vitamin D deficiency, unspecified: Secondary | ICD-10-CM

## 2018-04-29 DIAGNOSIS — E785 Hyperlipidemia, unspecified: Secondary | ICD-10-CM

## 2018-04-30 DIAGNOSIS — S42022D Displaced fracture of shaft of left clavicle, subsequent encounter for fracture with routine healing: Secondary | ICD-10-CM | POA: Diagnosis not present

## 2018-05-01 ENCOUNTER — Ambulatory Visit: Payer: PPO

## 2018-05-01 ENCOUNTER — Ambulatory Visit (INDEPENDENT_AMBULATORY_CARE_PROVIDER_SITE_OTHER): Payer: PPO

## 2018-05-01 VITALS — BP 104/76 | HR 77 | Temp 98.0°F | Ht 61.5 in | Wt 113.2 lb

## 2018-05-01 DIAGNOSIS — Z23 Encounter for immunization: Secondary | ICD-10-CM

## 2018-05-01 DIAGNOSIS — Z0000 Encounter for general adult medical examination without abnormal findings: Secondary | ICD-10-CM

## 2018-05-01 DIAGNOSIS — E785 Hyperlipidemia, unspecified: Secondary | ICD-10-CM

## 2018-05-01 DIAGNOSIS — E559 Vitamin D deficiency, unspecified: Secondary | ICD-10-CM | POA: Diagnosis not present

## 2018-05-01 LAB — COMPREHENSIVE METABOLIC PANEL
ALK PHOS: 55 U/L (ref 39–117)
ALT: 12 U/L (ref 0–35)
AST: 17 U/L (ref 0–37)
Albumin: 4.3 g/dL (ref 3.5–5.2)
BILIRUBIN TOTAL: 0.9 mg/dL (ref 0.2–1.2)
BUN: 15 mg/dL (ref 6–23)
CALCIUM: 9.5 mg/dL (ref 8.4–10.5)
CO2: 30 mEq/L (ref 19–32)
Chloride: 101 mEq/L (ref 96–112)
Creatinine, Ser: 0.78 mg/dL (ref 0.40–1.20)
GFR: 77.57 mL/min (ref >60.00)
GLUCOSE: 87 mg/dL (ref 70–99)
POTASSIUM: 4.1 meq/L (ref 3.5–5.1)
Sodium: 136 mEq/L (ref 135–145)
TOTAL PROTEIN: 6.8 g/dL (ref 6.0–8.3)

## 2018-05-01 LAB — LIPID PANEL
Cholesterol: 189 mg/dL (ref 0–200)
HDL: 55.1 mg/dL (ref >39.00)
LDL Cholesterol: 125 mg/dL — ABNORMAL HIGH (ref 0–99)
NONHDL: 133.57
TRIGLYCERIDES: 42 mg/dL (ref 0.0–149.0)
Total CHOL/HDL Ratio: 3
VLDL: 8.4 mg/dL (ref 0.0–40.0)

## 2018-05-01 LAB — VITAMIN D 25 HYDROXY (VIT D DEFICIENCY, FRACTURES): VITD: 22.43 ng/mL — AB (ref 30.00–100.00)

## 2018-05-01 NOTE — Progress Notes (Signed)
Subjective:   Darrian Goodwill is a 72 y.o. female who presents for Medicare Annual (Subsequent) preventive examination.  Review of Systems:  N/A Cardiac Risk Factors include: advanced age (>36men, >33 women);dyslipidemia     Objective:     Vitals: BP 104/76 (BP Location: Right Arm, Patient Position: Sitting, Cuff Size: Normal)   Pulse 77   Temp 98 F (36.7 C) (Oral)   Ht 5' 1.5" (1.562 m)   Wt 113 lb 4 oz (51.4 kg)   SpO2 97%   BMI 21.05 kg/m   Body mass index is 21.05 kg/m.  Advanced Directives 05/01/2018 03/19/2018 11/25/2017 04/08/2017  Does Patient Have a Medical Advance Directive? Yes Yes Yes Yes  Type of Paramedic of [place];Living will Living will;Healthcare Power of Attorney Living will [place];Living will  Does patient want to make changes to medical advance directive? - No - Patient declined - -  Copy of [place] in Chart? No - copy requested No - copy requested - Yes    Tobacco Social History   Tobacco Use  Smoking Status Never Smoker  Smokeless Tobacco Never Used     Counseling given: No   Clinical Intake:  Pre-visit preparation completed: Yes  Pain Score: 7 (left shoulder and arm)     Nutritional Status: BMI of 19-24  Normal Nutritional Risks: None Diabetes: No  How often do you need to have someone help you when you read instructions, pamphlets, or other written materials from your doctor or pharmacy?: 1 - Never What is the last grade level you completed in school?: Bachelor degree  Interpreter Needed?: No  Comments: pt lives with spouse Information entered by :: LPinson, LPN  Past Medical History:  Diagnosis Date  . Arthritis   . Cataract 2019   resolved with surgery by Dr. George Ina  . Chicken pox   . Osteoporosis   . Vertigo    Past Surgical History:  Procedure Laterality Date  . CATARACT EXTRACTION W/PHACO Left 03/18/2017   Procedure: CATARACT EXTRACTION PHACO AND  INTRAOCULAR LENS PLACEMENT (IOC);  Surgeon: Birder Robson, MD;  Location: [facility] ORS;  Service: Ophthalmology;  Laterality: Left;  [place] 01:23.9 AP% 20.9 CDE 17.58 Fluid Pack lot # [id number]  . CATARACT EXTRACTION W/PHACO Right 11/25/2017   Procedure: CATARACT EXTRACTION PHACO AND INTRAOCULAR LENS PLACEMENT (IOC);  Surgeon: Birder Robson, MD;  Location: [facility] ORS;  Service: Ophthalmology;  Laterality: Right;  [place] 00:41.5 AP% 16.7 CDE 6.95 Fluid Pack Lot # [id number]  . EYE SURGERY Left 03/2016   retina   Family History  Problem Relation Age of Onset  . Arthritis Mother    Social History   Socioeconomic History  . Marital status: Married    Spouse name: Not on file  . Number of children: Not on file  . Years of education: Not on file  . Highest education level: Not on file  Occupational History  . Not on file  Social Needs  . Financial resource strain: Not on file  . Food insecurity:    Worry: Not on file    Inability: Not on file  . Transportation needs:    Medical: Not on file    Non-medical: Not on file  Tobacco Use  . Smoking status: Never Smoker  . Smokeless tobacco: Never Used  Substance and Sexual Activity  . Alcohol use: Yes    Alcohol/week: 0.0 standard drinks    Comment: social  . Drug use: No  . Sexual  activity: Yes  Lifestyle  . Physical activity:    Days per week: Not on file    Minutes per session: Not on file  . Stress: Not on file  Relationships  . Social connections:    Talks on phone: Not on file    Gets together: Not on file    Attends religious service: Not on file    Active member of club or organization: Not on file    Attends meetings of clubs or organizations: Not on file    Relationship status: Not on file  Other Topics Concern  . Not on file  Social History Narrative   Married.   2 sons. 3 grandchildren.   Retired. Worked as a Water quality scientist.    Enjoys walking, decorating, reading, sewing.    Outpatient Encounter Medications as  of 05/01/2018  Medication Sig  . acetaminophen (TYLENOL) 325 MG tablet Take 2 tablets (650 mg total) by mouth every 4 (four) hours as needed for headache or mild pain.  . Calcium Carbonate-Vitamin D (CALCIUM-VITAMIN D3 PO) Take 1 tablet by mouth daily.  Marland Kitchen lactase (LACTAID) 3000 units tablet Take 3,000 Units by mouth daily as needed (for lactose intolerant).   . naproxen sodium (ALEVE) 220 MG tablet Take 220 mg by mouth daily as needed (for pain or headache).  . [DISCONTINUED] pantoprazole (PROTONIX) 40 MG tablet Take 1 tablet (40 mg total) by mouth daily.   No facility-administered encounter medications on file as of 05/01/2018.     Activities of Daily Living In your present state of health, do you have any difficulty performing the following activities: 05/01/2018 03/19/2018  Hearing? N N  Vision? N N  Difficulty concentrating or making decisions? N N  Walking or climbing stairs? N N  Dressing or bathing? N N  Doing errands, shopping? N N  Preparing Food and eating ? N -  Using the Toilet? N -  In the past six months, have you accidently leaked urine? N -  Do you have problems with loss of bowel control? N -  Managing your Medications? N -  Managing your Finances? N -  Housekeeping or managing your Housekeeping? N -  Some recent data might be hidden    Patient Care Team: Pleas Koch, NP as PCP - General (Internal Medicine)    Assessment:   This is a routine wellness examination for Liana.   Hearing Screening   125Hz  250Hz  500Hz  1000Hz  2000Hz  3000Hz  4000Hz  6000Hz  8000Hz   Right ear:   40 40 40  40    Left ear:   40 40 40  40    Vision Screening Comments: Vision exam in July 2019 with Dr. George Ina    Exercise Activities and Dietary recommendations Current Exercise Habits: Home exercise routine, Type of exercise: walking, Time (Minutes): 30, Frequency (Times/Week): 5, Weekly Exercise (Minutes/Week): 150, Intensity: Mild, Exercise limited by: None identified  Goals    .  Increase physical activity     As tolerated, I will continue to walk at least 30 min 4-5 days per week.        Fall Risk Fall Risk  05/01/2018 04/08/2017 12/26/2015  Falls in the past year? Yes No No  Comment pt reports LOC and subsequent fall with clavicle fracture - -  Number falls in past yr: 1 - -  Injury with Fall? Yes - -   Depression Screen PHQ 2/9 Scores 05/01/2018 04/08/2017 12/26/2015  PHQ - 2 Score 0 0 0  PHQ- 9 Score  0 0 -       Immunization History  Administered Date(s) Administered  . DTaP 09/14/2013  . Influenza, High Dose Seasonal PF 04/18/2015  . Influenza,inj,Quad PF,6+ Mos 05/21/2016, 04/08/2017, 05/01/2018  . Pneumococcal Conjugate-13 12/26/2015  . Pneumococcal Polysaccharide-23 11/14/2013  . Tdap 09/14/2013  . Zoster 07/24/2016    Screening Tests Health Maintenance  Topic Date Due  . MAMMOGRAM  12/28/2018 (Originally 01/10/2018)  . TETANUS/TDAP  09/15/2023  . COLONOSCOPY  01/09/2025  . INFLUENZA VACCINE  Completed  . DEXA SCAN  Completed  . Hepatitis C Screening  Completed  . PNA vac Low Risk Adult  Completed      Plan:  I have personally reviewed, addressed, and noted the following in the patient's chart:  A. Medical and social history B. Use of alcohol, tobacco or illicit drugs  C. Current medications and supplements D. Functional ability and status E.  Nutritional status F.  Physical activity G. Advance directives H. List of other physicians I.  Hospitalizations, surgeries, and ER visits in previous 12 months J.  [place] to include hearing, vision, cognitive, depression L. Referrals and appointments - none  In addition, I have reviewed and discussed with patient certain preventive protocols, quality metrics, and best practice recommendations. A written personalized care plan for preventive services as well as general preventive health recommendations were provided to patient.  See attached scanned questionnaire for additional  information.   Signed,   Lindell Noe, MHA, BS, LPN Health Coach

## 2018-05-01 NOTE — Progress Notes (Signed)
PCP notes:   Health maintenance:  Mammogram - addressed/pt will complete exam once fracture is healed Flu vaccine - administered  Abnormal screenings:   Fall risk - hx of fall Fall Risk  05/01/2018 04/08/2017 12/26/2015  Falls in the past year? Yes No No  Comment pt reports LOC and subsequent fall with clavicle fracture - -  Number falls in past yr: 1 - -  Injury with Fall? Yes - -    Patient concerns:   None  Nurse concerns:  None  Next PCP appt:   05/08/18 @ 1020  I reviewed health advisor's note, was available for consultation, and agree with documentation and plan. Loura Pardon MD

## 2018-05-01 NOTE — Patient Instructions (Signed)
Jackie Oneal , Thank you for taking time to come for your Medicare Wellness Visit. I appreciate your ongoing commitment to your health goals. Please review the following plan we discussed and let me know if I can assist you in the future.   These are the goals we discussed: Goals    . Increase physical activity     As tolerated, I will continue to walk at least 30 min 4-5 days per week.        This is a list of the screening recommended for you and due dates:  Health Maintenance  Topic Date Due  . Mammogram  12/28/2018*  . Tetanus Vaccine  09/15/2023  . Colon Cancer Screening  01/09/2025  . Flu Shot  Completed  . DEXA scan (bone density measurement)  Completed  .  Hepatitis C: One time screening is recommended by Center for Disease Control  (CDC) for  adults born from [birth year] through 1963.   Completed  . Pneumonia vaccines  Completed  *Topic was postponed. The date shown is not the original due date.   Preventive Care for Adults  A healthy lifestyle and preventive care can promote health and wellness. Preventive health guidelines for adults include the following key practices.  . A routine yearly physical is a good way to check with your health care provider about your health and preventive screening. It is a chance to share any concerns and updates on your health and to receive a thorough exam.  . Visit your dentist for a routine exam and preventive care every 6 months. Brush your teeth twice a day and floss once a day. Good oral hygiene prevents tooth decay and gum disease.  . The frequency of eye exams is based on your age, health, family medical history, use  of contact lenses, and other factors. Follow your health care provider's recommendations for frequency of eye exams.  . Eat a healthy diet. Foods like vegetables, fruits, whole grains, low-fat dairy products, and lean protein foods contain the nutrients you need without too many calories. Decrease your intake of foods high  in solid fats, added sugars, and salt. Eat the right amount of calories for you. Get information about a proper diet from your health care provider, if necessary.  . Regular physical exercise is one of the most important things you can do for your health. Most adults should get at least 150 minutes of moderate-intensity exercise (any activity that increases your heart rate and causes you to sweat) each week. In addition, most adults need muscle-strengthening exercises on 2 or more days a week.  Silver Sneakers may be a benefit available to you. To determine eligibility, you may visit the website: www.silversneakers.com or contact program at 908-862-6628 Mon-Fri between 8AM-8PM.   . Maintain a healthy weight. The body mass index (BMI) is a screening tool to identify possible weight problems. It provides an estimate of body fat based on height and weight. Your health care provider can find your BMI and can help you achieve or maintain a healthy weight.   For adults 20 years and older: ? A BMI below 18.5 is considered underweight. ? A BMI of 18.5 to 24.9 is normal. ? A BMI of 25 to 29.9 is considered overweight. ? A BMI of 30 and above is considered obese.   . Maintain normal blood lipids and cholesterol levels by exercising and minimizing your intake of saturated fat. Eat a balanced diet with plenty of fruit and vegetables. Blood tests for  lipids and cholesterol should begin at age 73 and be repeated every 5 years. If your lipid or cholesterol levels are high, you are over 50, or you are at high risk for heart disease, you may need your cholesterol levels checked more frequently. Ongoing high lipid and cholesterol levels should be treated with medicines if diet and exercise are not working.  . If you smoke, find out from your health care provider how to quit. If you do not use tobacco, please do not start.  . If you choose to drink alcohol, please do not consume more than 2 drinks per day. One  drink is considered to be 12 ounces (355 mL) of beer, 5 ounces (148 mL) of wine, or 1.5 ounces (44 mL) of liquor.  . If you are 51-18 years old, ask your health care provider if you should take aspirin to prevent strokes.  . Use sunscreen. Apply sunscreen liberally and repeatedly throughout the day. You should seek shade when your shadow is shorter than you. Protect yourself by wearing long sleeves, pants, a wide-brimmed hat, and sunglasses year round, whenever you are outdoors.  . Once a month, do a whole body skin exam, using a mirror to look at the skin on your back. Tell your health care provider of new moles, moles that have irregular borders, moles that are larger than a pencil eraser, or moles that have changed in shape or color.

## 2018-05-08 ENCOUNTER — Encounter: Payer: Self-pay | Admitting: Primary Care

## 2018-05-08 ENCOUNTER — Encounter: Payer: PPO | Admitting: Primary Care

## 2018-05-08 ENCOUNTER — Ambulatory Visit (INDEPENDENT_AMBULATORY_CARE_PROVIDER_SITE_OTHER): Payer: PPO | Admitting: Primary Care

## 2018-05-08 VITALS — BP 104/60 | HR 78 | Temp 98.2°F | Ht 61.5 in | Wt 113.8 lb

## 2018-05-08 DIAGNOSIS — H8110 Benign paroxysmal vertigo, unspecified ear: Secondary | ICD-10-CM

## 2018-05-08 DIAGNOSIS — R55 Syncope and collapse: Secondary | ICD-10-CM | POA: Diagnosis not present

## 2018-05-08 DIAGNOSIS — Z1239 Encounter for other screening for malignant neoplasm of breast: Secondary | ICD-10-CM

## 2018-05-08 DIAGNOSIS — Z0000 Encounter for general adult medical examination without abnormal findings: Secondary | ICD-10-CM

## 2018-05-08 DIAGNOSIS — E785 Hyperlipidemia, unspecified: Secondary | ICD-10-CM

## 2018-05-08 DIAGNOSIS — E2839 Other primary ovarian failure: Secondary | ICD-10-CM

## 2018-05-08 DIAGNOSIS — M810 Age-related osteoporosis without current pathological fracture: Secondary | ICD-10-CM

## 2018-05-08 DIAGNOSIS — E559 Vitamin D deficiency, unspecified: Secondary | ICD-10-CM | POA: Diagnosis not present

## 2018-05-08 NOTE — Progress Notes (Signed)
Subjective:    Patient ID: Jackie Oneal, female    DOB: [birth year], 72 y.o.   MRN: [id number]  HPI  Jackie Oneal is a 72 year old female who presents today for complete physical.  Immunizations: -Tetanus: Completed in 2015 -Influenza: Completed this season -Pneumonia: Completed in 2017 -Shingles: Completed in 2017  Diet: She endorses a healthy diet. Breakfast: Fruit, cereal Lunch: Restaurants, sometimes left overs Dinner: sometimes restaurant food, fruit, cheese and crackers, avocado  Snacks: Occasionally nuts, fruit Desserts: Rarely  Beverages: Chocolate soy milk, water, wine  Exercise: She was walking 5-6 days weekly for 30-40 minutes Eye exam: Completed 2019 Dental exam: Completes semi-annually  Colonoscopy: Completed in 2016, due in 2026 Dexa: Completed in 2017 Mammogram: Completed in 2017 Hep C Screen: negative in 2017   Review of Systems  Constitutional: Negative for unexpected weight change.  HENT: Negative for rhinorrhea.   Respiratory: Negative for cough and shortness of breath.   Cardiovascular: Negative for chest pain.  Gastrointestinal: Negative for constipation and diarrhea.  Genitourinary: Negative for difficulty urinating.  Musculoskeletal: Positive for arthralgias. Negative for myalgias.       Left clavicle and shoulder pain since fracture in August 2019. Overall doing better.   Skin: Negative for rash.  Allergic/Immunologic: Negative for environmental allergies.  Neurological: Negative for dizziness, numbness and headaches.  Psychiatric/Behavioral: The patient is not nervous/anxious.        Past Medical History:  Diagnosis Date  . Arthritis   . Cataract 2019   resolved with surgery by Dr. George Ina  . Chicken pox   . Osteoporosis   . Vertigo      Social History   Socioeconomic History  . Marital status: Married    Spouse name: Not on file  . Number of children: Not on file  . Years of education: Not on file  . Highest education  level: Not on file  Occupational History  . Not on file  Social Needs  . Financial resource strain: Not on file  . Food insecurity:    Worry: Not on file    Inability: Not on file  . Transportation needs:    Medical: Not on file    Non-medical: Not on file  Tobacco Use  . Smoking status: Never Smoker  . Smokeless tobacco: Never Used  Substance and Sexual Activity  . Alcohol use: Yes    Alcohol/week: 0.0 standard drinks    Comment: social  . Drug use: No  . Sexual activity: Yes  Lifestyle  . Physical activity:    Days per week: Not on file    Minutes per session: Not on file  . Stress: Not on file  Relationships  . Social connections:    Talks on phone: Not on file    Gets together: Not on file    Attends religious service: Not on file    Active member of club or organization: Not on file    Attends meetings of clubs or organizations: Not on file    Relationship status: Not on file  . Intimate partner violence:    Fear of current or ex partner: Not on file    Emotionally abused: Not on file    Physically abused: Not on file    Forced sexual activity: Not on file  Other Topics Concern  . Not on file  Social History Narrative   Married.   2 sons. 3 grandchildren.   Retired. Worked as a Water quality scientist.    Enjoys walking, decorating,  reading, sewing.    Past Surgical History:  Procedure Laterality Date  . CATARACT EXTRACTION W/PHACO Left 03/18/2017   Procedure: CATARACT EXTRACTION PHACO AND INTRAOCULAR LENS PLACEMENT (IOC);  Surgeon: Birder Robson, MD;  Location: [facility] ORS;  Service: Ophthalmology;  Laterality: Left;  [place] 01:23.9 AP% 20.9 CDE 17.58 Fluid Pack lot # [id number]  . CATARACT EXTRACTION W/PHACO Right 11/25/2017   Procedure: CATARACT EXTRACTION PHACO AND INTRAOCULAR LENS PLACEMENT (IOC);  Surgeon: Birder Robson, MD;  Location: [facility] ORS;  Service: Ophthalmology;  Laterality: Right;  [place] 00:41.5 AP% 16.7 CDE 6.95 Fluid Pack Lot # [id number]  . EYE  SURGERY Left 03/2016   retina    Family History  Problem Relation Age of Onset  . Arthritis Mother     Allergies  Allergen Reactions  . Sulfa Antibiotics Other (See Comments)    Childhood allergy    Current Outpatient Medications on File Prior to Visit  Medication Sig Dispense Refill  . acetaminophen (TYLENOL) 325 MG tablet Take 2 tablets (650 mg total) by mouth every 4 (four) hours as needed for headache or mild pain. 30 tablet 0  . Calcium Carbonate-Vitamin D (CALCIUM-VITAMIN D3 PO) Take 1 tablet by mouth daily.    Marland Kitchen lactase (LACTAID) 3000 units tablet Take 3,000 Units by mouth daily as needed (for lactose intolerant).     . naproxen sodium (ALEVE) 220 MG tablet Take 220 mg by mouth daily as needed (for pain or headache).     No current facility-administered medications on file prior to visit.     BP 104/60   Pulse 78   Temp 98.2 F (36.8 C) (Oral)   Ht 5' 1.5" (1.562 m)   Wt 113 lb 12 oz (51.6 kg)   SpO2 98%   BMI 21.14 kg/m    Objective:   Physical Exam  Constitutional: She is oriented to person, place, and time. She appears well-nourished.  HENT:  Mouth/Throat: No oropharyngeal exudate.  Eyes: Pupils are equal, round, and reactive to light. EOM are normal.  Neck: Neck supple. No thyromegaly present.  Cardiovascular: Normal rate and regular rhythm.  Respiratory: Effort normal and breath sounds normal.  GI: Soft. Bowel sounds are normal. There is no tenderness.  Musculoskeletal:  Decrease in ROM with left upper extremity, fractured clavicle in August.   Neurological: She is alert and oriented to person, place, and time.  Skin: Skin is warm and dry.  Psychiatric: She has a normal mood and affect.           Assessment & Plan:

## 2018-05-08 NOTE — Assessment & Plan Note (Signed)
Recent level low. Continue calcium and vitamin d combination, add in 1000 units of vitamin D daily. Bone density scan pending.

## 2018-05-08 NOTE — Assessment & Plan Note (Signed)
No near syncope or syncope since incidence in August. Discussed to change positions slowly when laying to sitting/standing.

## 2018-05-08 NOTE — Assessment & Plan Note (Signed)
Repeat bone density pending.  She is taking calcium and vitamin D. Recent vitamin D level low, will add on 1000 units daily.   Discussed to consider other treatment such as Prolia. She did experience side effects to bisphosphonate.

## 2018-05-08 NOTE — Assessment & Plan Note (Signed)
Intermittent, overall manages on her own.

## 2018-05-08 NOTE — Patient Instructions (Signed)
Continue taking your calcium and vitamin D. Add in extra vitamin D 1000 units once daily.  Call the [facility] to schedule your mammogram and bone density tests.  Ensure you are consuming 64 ounces of water daily.  Continue to eat a healthy diet.   Continue to work exercising. You should be getting 150 minutes of exercise weekly.  We will see you in one year for your annual exam or sooner if needed.  It was a pleasure to see you today!   Preventive Care 30 Years and Older, Female Preventive care refers to lifestyle choices and visits with your health care provider that can promote health and wellness. What does preventive care include?  A yearly physical exam. This is also called an annual well check.  Dental exams once or twice a year.  Routine eye exams. Ask your health care provider how often you should have your eyes checked.  Personal lifestyle choices, including: ? Daily care of your teeth and gums. ? Regular physical activity. ? Eating a healthy diet. ? Avoiding tobacco and drug use. ? Limiting alcohol use. ? Practicing safe sex. ? Taking low-dose aspirin every day. ? Taking vitamin and mineral supplements as recommended by your health care provider. What happens during an annual well check? The services and screenings done by your health care provider during your annual well check will depend on your age, overall health, lifestyle risk factors, and family history of disease. Counseling Your health care provider may ask you questions about your:  Alcohol use.  Tobacco use.  Drug use.  Emotional well-being.  Home and relationship well-being.  Sexual activity.  Eating habits.  History of falls.  Memory and ability to understand (cognition).  Work and work Statistician.  Reproductive health.  Screening You may have the following tests or measurements:  Height, weight, and BMI.  Blood pressure.  Lipid and cholesterol levels. These may  be checked every 5 years, or more frequently if you are over 16 years old.  Skin check.  Lung cancer screening. You may have this screening every year starting at age 52 if you have a 30-pack-year history of smoking and currently smoke or have quit within the past 15 years.  Fecal occult blood test (FOBT) of the stool. You may have this test every year starting at age 26.  Flexible sigmoidoscopy or colonoscopy. You may have a sigmoidoscopy every 5 years or a colonoscopy every 10 years starting at age 64.  Hepatitis C blood test.  Hepatitis B blood test.  Sexually transmitted disease (STD) testing.  Diabetes screening. This is done by checking your blood sugar (glucose) after you have not eaten for a while (fasting). You may have this done every 1-3 years.  Bone density scan. This is done to screen for osteoporosis. You may have this done starting at age 58.  Mammogram. This may be done every 1-2 years. Talk to your health care provider about how often you should have regular mammograms.  Talk with your health care provider about your test results, treatment options, and if necessary, the need for more tests. Vaccines Your health care provider may recommend certain vaccines, such as:  Influenza vaccine. This is recommended every year.  Tetanus, diphtheria, and acellular pertussis (Tdap, Td) vaccine. You may need a Td booster every 10 years.  Varicella vaccine. You may need this if you have not been vaccinated.  Zoster vaccine. You may need this after age 25.  Measles, mumps, and rubella (MMR) vaccine.  You may need at least one dose of MMR if you were born in 1957 or later. You may also need a second dose.  Pneumococcal 13-valent conjugate (PCV13) vaccine. One dose is recommended after age 77.  Pneumococcal polysaccharide (PPSV23) vaccine. One dose is recommended after age 54.  Meningococcal vaccine. You may need this if you have certain conditions.  Hepatitis A vaccine. You  may need this if you have certain conditions or if you travel or work in places where you may be exposed to hepatitis A.  Hepatitis B vaccine. You may need this if you have certain conditions or if you travel or work in places where you may be exposed to hepatitis B.  Haemophilus influenzae type b (Hib) vaccine. You may need this if you have certain conditions.  Talk to your health care provider about which screenings and vaccines you need and how often you need them. This information is not intended to replace advice given to you by your health care provider. Make sure you discuss any questions you have with your health care provider. Document Released: 08/11/2015 Document Revised: 04/03/2016 Document Reviewed: 05/16/2015 Elsevier Interactive Patient Education  [employer].

## 2018-05-08 NOTE — Assessment & Plan Note (Signed)
Stable and at goal on recent labs. Continue to monitor.

## 2018-05-08 NOTE — Assessment & Plan Note (Signed)
Immunizations UTD. Mammogram and bone density pending.  Colonoscopy UTD. Commended her on a healthy diet and regular exercise. Exam stable. Labs reviewed. Follow up in year for CPE.

## 2018-06-01 DIAGNOSIS — L570 Actinic keratosis: Secondary | ICD-10-CM | POA: Diagnosis not present

## 2018-06-01 DIAGNOSIS — L578 Other skin changes due to chronic exposure to nonionizing radiation: Secondary | ICD-10-CM | POA: Diagnosis not present

## 2018-06-01 DIAGNOSIS — L821 Other seborrheic keratosis: Secondary | ICD-10-CM | POA: Diagnosis not present

## 2018-07-03 DIAGNOSIS — H43813 Vitreous degeneration, bilateral: Secondary | ICD-10-CM | POA: Diagnosis not present

## 2018-07-30 ENCOUNTER — Ambulatory Visit
Admission: RE | Admit: 2018-07-30 | Discharge: 2018-07-30 | Disposition: A | Discharge status: Home / Self Care | Payer: PPO | Source: Ambulatory Visit | Attending: Primary Care | Admitting: Primary Care

## 2018-07-30 DIAGNOSIS — Z1231 Encounter for screening mammogram for malignant neoplasm of breast: Secondary | ICD-10-CM | POA: Diagnosis not present

## 2018-07-30 DIAGNOSIS — E2839 Other primary ovarian failure: Secondary | ICD-10-CM | POA: Diagnosis not present

## 2018-07-30 DIAGNOSIS — M810 Age-related osteoporosis without current pathological fracture: Secondary | ICD-10-CM

## 2018-07-30 DIAGNOSIS — Z1239 Encounter for other screening for malignant neoplasm of breast: Secondary | ICD-10-CM

## 2018-08-04 ENCOUNTER — Telehealth: Payer: Self-pay | Admitting: Primary Care

## 2018-08-04 ENCOUNTER — Telehealth: Payer: Self-pay | Admitting: *Deleted

## 2018-08-04 DIAGNOSIS — M810 Age-related osteoporosis without current pathological fracture: Secondary | ICD-10-CM

## 2018-08-04 MED ORDER — IBANDRONATE SODIUM 150 MG PO TABS: ORAL_TABLET | ORAL | 3 refills | Status: DC

## 2018-08-04 NOTE — Telephone Encounter (Signed)
Best number [phone] Pt would like chan to call her regarding osteoporosis meds

## 2018-08-04 NOTE — Telephone Encounter (Signed)
We can try Boniva, this is taken once monthly rather than once weekly like Fosamax.  I'll send a prescription to her pharmacy. It's taken once monthly in the morning on an empty stomach with no food or other medicines for 30 minutes.  Avoid laying flat for 1 hour after taking. Please have her call me if it is too expensive or if she experiences side effects.  FYI to [place].

## 2018-08-04 NOTE — Telephone Encounter (Signed)
Spoken to patient and she decided that she would like to oral medication instead of Prolia. She cannot remember the medications that did not work for her. She ask if Jackie Oneal can pick one that have less side effects.

## 2018-08-04 NOTE — Telephone Encounter (Signed)
Information has been submitted to pts insurance for verification of benefits. Awaiting response for coverage  

## 2018-08-05 NOTE — Telephone Encounter (Signed)
Pt has decided not to proceed with prolia. See additional phone note

## 2018-08-05 NOTE — Telephone Encounter (Signed)
Spoken and notified patient of Kate Clark's comments. Patient verbalized understanding.  

## 2018-08-05 NOTE — Telephone Encounter (Signed)
See additional phone note. 

## 2018-08-07 DIAGNOSIS — L578 Other skin changes due to chronic exposure to nonionizing radiation: Secondary | ICD-10-CM | POA: Diagnosis not present

## 2018-08-07 DIAGNOSIS — L570 Actinic keratosis: Secondary | ICD-10-CM | POA: Diagnosis not present

## 2018-08-27 DIAGNOSIS — H26491 Other secondary cataract, right eye: Secondary | ICD-10-CM | POA: Diagnosis not present

## 2018-09-29 ENCOUNTER — Ambulatory Visit (INDEPENDENT_AMBULATORY_CARE_PROVIDER_SITE_OTHER): Payer: PPO | Admitting: Primary Care

## 2018-09-29 ENCOUNTER — Encounter: Payer: Self-pay | Admitting: Primary Care

## 2018-09-29 VITALS — BP 106/72 | HR 78 | Temp 98.2°F | Ht 61.5 in | Wt 113.2 lb

## 2018-09-29 DIAGNOSIS — N941 Unspecified dyspareunia: Secondary | ICD-10-CM

## 2018-09-29 DIAGNOSIS — N952 Postmenopausal atrophic vaginitis: Secondary | ICD-10-CM | POA: Diagnosis not present

## 2018-09-29 HISTORY — DX: Unspecified dyspareunia: N94.10

## 2018-09-29 MED ORDER — ESTRADIOL 0.1 MG/GM VA CREA: TOPICAL_CREAM | VAGINAL | 3 refills | Status: DC

## 2018-09-29 NOTE — Patient Instructions (Signed)
Insert the applicator of low dose estrogen cream vaginally 2-3 times weekly for vaginal dryness and atrophy.  Please update me in 3-4 weeks if your symptoms do not improve.   It was a pleasure to see you today!

## 2018-09-29 NOTE — Assessment & Plan Note (Signed)
Present for the last one month. Suspect due to vaginal atrophy from menopause. No obvious injury or infection. Wet prep completed and sent off for testing. Will trial low dose Estrace cream vaginally 2-3 times weekly. She will update.

## 2018-09-29 NOTE — Progress Notes (Signed)
Subjective:    Patient ID: Jackie Oneal, female    DOB: 27-Jul-1946, 73 y.o.   MRN: [id number]  HPI  Jackie Oneal is a 73 year old female who presents today who presents today with dyspareunia.  Symptoms began about one month ago, only during intercourse. She has noticed tearing to the skin of the introitus during her last two episodes of intercourse. She denies changes in vaginal dryness since menopause. She does use a lubricant with intercourse.   She denies vaginal discharge, vaginal itching, dysuria, hematuria.    Review of Systems  Constitutional: Negative for fever.  Genitourinary: Positive for dyspareunia. Negative for dysuria, frequency, hematuria, urgency and vaginal discharge.       Past Medical History:  Diagnosis Date  . Arthritis   . Cataract 2019   resolved with surgery by Dr. George Ina  . Chicken pox   . Osteoporosis   . Vertigo      Social History   Socioeconomic History  . Marital status: Married    Spouse name: Not on file  . Number of children: Not on file  . Years of education: Not on file  . Highest education level: Not on file  Occupational History  . Not on file  Social Needs  . Financial resource strain: Not on file  . Food insecurity:    Worry: Not on file    Inability: Not on file  . Transportation needs:    Medical: Not on file    Non-medical: Not on file  Tobacco Use  . Smoking status: Never Smoker  . Smokeless tobacco: Never Used  Substance and Sexual Activity  . Alcohol use: Yes    Alcohol/week: 0.0 standard drinks    Comment: social  . Drug use: No  . Sexual activity: Yes  Lifestyle  . Physical activity:    Days per week: Not on file    Minutes per session: Not on file  . Stress: Not on file  Relationships  . Social connections:    Talks on phone: Not on file    Gets together: Not on file    Attends religious service: Not on file    Active member of club or organization: Not on file    Attends meetings of clubs or  organizations: Not on file    Relationship status: Not on file  . Intimate partner violence:    Fear of current or ex partner: Not on file    Emotionally abused: Not on file    Physically abused: Not on file    Forced sexual activity: Not on file  Other Topics Concern  . Not on file  Social History Narrative   Married.   2 sons. 3 grandchildren.   Retired. Worked as a Water quality scientist.    Enjoys walking, decorating, reading, sewing.    Past Surgical History:  Procedure Laterality Date  . CATARACT EXTRACTION W/PHACO Left 03/18/2017   Procedure: CATARACT EXTRACTION PHACO AND INTRAOCULAR LENS PLACEMENT (IOC);  Surgeon: Birder Robson, MD;  Location: [facility] ORS;  Service: Ophthalmology;  Laterality: Left;  [place] 01:23.9 AP% 20.9 CDE 17.58 Fluid Pack lot # [id number]  . CATARACT EXTRACTION W/PHACO Right 11/25/2017   Procedure: CATARACT EXTRACTION PHACO AND INTRAOCULAR LENS PLACEMENT (IOC);  Surgeon: Birder Robson, MD;  Location: [facility] ORS;  Service: Ophthalmology;  Laterality: Right;  [place] 00:41.5 AP% 16.7 CDE 6.95 Fluid Pack Lot # [id number]  . EYE SURGERY Left 03/2016   retina    Family History  Problem Relation Age  of Onset  . Arthritis Mother     Allergies  Allergen Reactions  . Sulfa Antibiotics Other (See Comments)    Childhood allergy    Current Outpatient Medications on File Prior to Visit  Medication Sig Dispense Refill  . acetaminophen (TYLENOL) 325 MG tablet Take 2 tablets (650 mg total) by mouth every 4 (four) hours as needed for headache or mild pain. 30 tablet 0  . Calcium Carbonate-Vitamin D (CALCIUM-VITAMIN D3 PO) Take 1 tablet by mouth daily.    Marland Kitchen ibandronate (BONIVA) 150 MG tablet Take one tablet by mouth once monthly in the morning with water. No food or other medications for 30 min. Do not lay flat for 1 hour. 3 tablet 3  . lactase (LACTAID) 3000 units tablet Take 3,000 Units by mouth daily as needed (for lactose intolerant).     . naproxen sodium (ALEVE) 220  MG tablet Take 220 mg by mouth daily as needed (for pain or headache).     No current facility-administered medications on file prior to visit.     BP 106/72   Pulse 78   Temp 98.2 F (36.8 C) (Oral)   Ht 5' 1.5" (1.562 m)   Wt 113 lb 4 oz (51.4 kg)   SpO2 98%   BMI 21.05 kg/m    Objective:   Physical Exam  Genitourinary: There is no lesion on the right labia. There is no lesion on the left labia. Cervix exhibits discharge. Right adnexum displays no tenderness. Left adnexum displays no tenderness.    No vaginal discharge or erythema.  No erythema in the vagina.    No signs of injury in the vagina.     Genitourinary Comments: Vaginal atrophy noted. Scant whitish discharge from cervix.   Skin: Skin is warm and dry.           Assessment & Plan:

## 2018-09-30 LAB — WET PREP BY MOLECULAR PROBE
Candida species: NOT DETECTED
Gardnerella vaginalis: NOT DETECTED
MICRO NUMBER:: 269846
SPECIMEN QUALITY:: ADEQUATE
Trichomonas vaginosis: NOT DETECTED

## 2019-01-12 DIAGNOSIS — L578 Other skin changes due to chronic exposure to nonionizing radiation: Secondary | ICD-10-CM | POA: Diagnosis not present

## 2019-01-12 DIAGNOSIS — L570 Actinic keratosis: Secondary | ICD-10-CM | POA: Diagnosis not present

## 2019-01-14 ENCOUNTER — Other Ambulatory Visit: Payer: Self-pay

## 2019-01-14 ENCOUNTER — Observation Stay
Admission: EM | Admit: 2019-01-14 | Discharge: 2019-01-14 | Disposition: A | Discharge status: Home / Self Care | Payer: PPO | Attending: Internal Medicine | Admitting: Internal Medicine

## 2019-01-14 ENCOUNTER — Encounter: Payer: Self-pay | Admitting: Emergency Medicine

## 2019-01-14 ENCOUNTER — Emergency Department: Payer: PPO

## 2019-01-14 DIAGNOSIS — M1990 Unspecified osteoarthritis, unspecified site: Secondary | ICD-10-CM | POA: Diagnosis not present

## 2019-01-14 DIAGNOSIS — R1030 Lower abdominal pain, unspecified: Secondary | ICD-10-CM | POA: Insufficient documentation

## 2019-01-14 DIAGNOSIS — M810 Age-related osteoporosis without current pathological fracture: Secondary | ICD-10-CM | POA: Insufficient documentation

## 2019-01-14 DIAGNOSIS — R1013 Epigastric pain: Secondary | ICD-10-CM | POA: Diagnosis not present

## 2019-01-14 DIAGNOSIS — R079 Chest pain, unspecified: Secondary | ICD-10-CM | POA: Diagnosis present

## 2019-01-14 DIAGNOSIS — Z881 Allergy status to other antibiotic agents status: Secondary | ICD-10-CM | POA: Diagnosis not present

## 2019-01-14 DIAGNOSIS — M549 Dorsalgia, unspecified: Secondary | ICD-10-CM | POA: Insufficient documentation

## 2019-01-14 DIAGNOSIS — K219 Gastro-esophageal reflux disease without esophagitis: Secondary | ICD-10-CM | POA: Diagnosis not present

## 2019-01-14 DIAGNOSIS — R42 Dizziness and giddiness: Secondary | ICD-10-CM | POA: Diagnosis not present

## 2019-01-14 DIAGNOSIS — R0789 Other chest pain: Principal | ICD-10-CM | POA: Insufficient documentation

## 2019-01-14 DIAGNOSIS — Z882 Allergy status to sulfonamides status: Secondary | ICD-10-CM | POA: Diagnosis not present

## 2019-01-14 DIAGNOSIS — Z1159 Encounter for screening for other viral diseases: Secondary | ICD-10-CM | POA: Diagnosis not present

## 2019-01-14 DIAGNOSIS — Z79899 Other long term (current) drug therapy: Secondary | ICD-10-CM | POA: Diagnosis not present

## 2019-01-14 DIAGNOSIS — Z20828 Contact with and (suspected) exposure to other viral communicable diseases: Secondary | ICD-10-CM | POA: Diagnosis not present

## 2019-01-14 DIAGNOSIS — N200 Calculus of kidney: Secondary | ICD-10-CM | POA: Diagnosis not present

## 2019-01-14 HISTORY — DX: Chest pain, unspecified: R07.9

## 2019-01-14 LAB — CBC WITH DIFFERENTIAL/PLATELET
Abs Immature Granulocytes: 0.01 10*3/uL (ref 0.00–0.07)
Basophils Absolute: 0 10*3/uL (ref 0.0–0.1)
Basophils Relative: 0 %
Eosinophils Absolute: 0.1 10*3/uL (ref 0.0–0.5)
Eosinophils Relative: 2 %
HCT: 37.5 % (ref 36.0–46.0)
Hemoglobin: 12.4 g/dL (ref 12.0–15.0)
Immature Granulocytes: 0 %
Lymphocytes Relative: 51 %
Lymphs Abs: 4.4 10*3/uL — ABNORMAL HIGH (ref 0.7–4.0)
MCH: 30.9 pg (ref 26.0–34.0)
MCHC: 33.1 g/dL (ref 30.0–36.0)
MCV: 93.5 fL (ref 80.0–100.0)
Monocytes Absolute: 0.4 10*3/uL (ref 0.1–1.0)
Monocytes Relative: 5 %
Neutro Abs: 3.5 10*3/uL (ref 1.7–7.7)
Neutrophils Relative %: 42 %
Platelets: 242 10*3/uL (ref 150–400)
RBC: 4.01 MIL/uL (ref 3.87–5.11)
RDW: 12.7 % (ref 11.5–15.5)
WBC: 8.5 10*3/uL (ref 4.0–10.5)
nRBC: 0 % (ref 0.0–0.2)

## 2019-01-14 LAB — URINALYSIS, COMPLETE (UACMP) WITH MICROSCOPIC
Bacteria, UA: NONE SEEN
Bilirubin Urine: NEGATIVE
Glucose, UA: NEGATIVE mg/dL
Hgb urine dipstick: NEGATIVE
Ketones, ur: NEGATIVE mg/dL
Leukocytes,Ua: NEGATIVE
Nitrite: NEGATIVE
Protein, ur: NEGATIVE mg/dL
Specific Gravity, Urine: 1.012 (ref 1.005–1.030)
pH: 7 (ref 5.0–8.0)

## 2019-01-14 LAB — COMPREHENSIVE METABOLIC PANEL
ALT: 15 U/L (ref 0–44)
AST: 21 U/L (ref 15–41)
Albumin: 4.5 g/dL (ref 3.5–5.0)
Alkaline Phosphatase: 51 U/L (ref 38–126)
Anion gap: 12 (ref 5–15)
BUN: 14 mg/dL (ref 8–23)
CO2: 25 mmol/L (ref 22–32)
Calcium: 9.4 mg/dL (ref 8.9–10.3)
Chloride: 103 mmol/L (ref 98–111)
Creatinine, Ser: 0.81 mg/dL (ref 0.44–1.00)
GFR calc Af Amer: >60 mL/min (ref >60)
GFR calc non Af Amer: >60 mL/min (ref >60)
Glucose, Bld: 117 mg/dL — ABNORMAL HIGH (ref 70–99)
Potassium: 3.2 mmol/L — ABNORMAL LOW (ref 3.5–5.1)
Sodium: 140 mmol/L (ref 135–145)
Total Bilirubin: 0.9 mg/dL (ref 0.3–1.2)
Total Protein: 7.1 g/dL (ref 6.5–8.1)

## 2019-01-14 LAB — LIPASE, BLOOD: Lipase: 52 U/L — ABNORMAL HIGH (ref 11–51)

## 2019-01-14 LAB — GLUCOSE, CAPILLARY: Glucose-Capillary: 97 mg/dL (ref 70–99)

## 2019-01-14 LAB — TROPONIN I
Troponin I: <0.03 ng/mL (ref <0.03)
Troponin I: <0.03 ng/mL (ref <0.03)
Troponin I: <0.03 ng/mL (ref <0.03)

## 2019-01-14 MED ORDER — SODIUM CHLORIDE 0.9% FLUSH
3.0000 mL | Freq: Two times a day (BID) | INTRAVENOUS | Status: DC
  Administered 2019-01-14: 3 mL via INTRAVENOUS

## 2019-01-14 MED ORDER — ENOXAPARIN SODIUM 40 MG/0.4ML ~~LOC~~ SOLN: 40.0000 mg | SUBCUTANEOUS | Status: DC

## 2019-01-14 MED ORDER — ONDANSETRON HCL 4 MG PO TABS: 4.0000 mg | ORAL_TABLET | Freq: Four times a day (QID) | ORAL | Status: DC | PRN

## 2019-01-14 MED ORDER — MORPHINE SULFATE (PF) 2 MG/ML IV SOLN
2.0000 mg | Freq: Once | INTRAVENOUS | Status: AC
  Administered 2019-01-14: 08:00:00 2 mg via INTRAVENOUS

## 2019-01-14 MED ORDER — ASPIRIN EC 81 MG PO TBEC: 81.0000 mg | DELAYED_RELEASE_TABLET | Freq: Every day | ORAL | Status: DC

## 2019-01-14 MED ORDER — MORPHINE SULFATE (PF) 2 MG/ML IV SOLN
INTRAVENOUS | Status: AC
  Administered 2019-01-14: 08:00:00 2 mg via INTRAVENOUS
  Filled 2019-01-14: qty 1

## 2019-01-14 MED ORDER — IOPAMIDOL (ISOVUE-370) INJECTION 76%
100.0000 mL | Freq: Once | INTRAVENOUS | Status: AC | PRN
  Administered 2019-01-14: 100 mL via INTRAVENOUS

## 2019-01-14 MED ORDER — ONDANSETRON HCL 4 MG/2ML IJ SOLN: 4.0000 mg | Freq: Four times a day (QID) | INTRAMUSCULAR | Status: DC | PRN

## 2019-01-14 MED ORDER — SODIUM CHLORIDE 0.9 % IV SOLN
1000.0000 mL | Freq: Once | INTRAVENOUS | Status: AC
  Administered 2019-01-14: 1000 mL via INTRAVENOUS

## 2019-01-14 MED ORDER — IBUPROFEN 400 MG PO TABS: 400.0000 mg | ORAL_TABLET | Freq: Four times a day (QID) | ORAL | Status: DC | PRN

## 2019-01-14 MED ORDER — POLYETHYLENE GLYCOL 3350 17 G PO PACK: 17.0000 g | PACK | Freq: Every day | ORAL | Status: DC | PRN

## 2019-01-14 MED ORDER — ONDANSETRON HCL 4 MG/2ML IJ SOLN
4.0000 mg | Freq: Once | INTRAMUSCULAR | Status: AC
  Administered 2019-01-14: 08:00:00 4 mg via INTRAVENOUS
  Filled 2019-01-14: qty 2

## 2019-01-14 MED ORDER — ALBUTEROL SULFATE (2.5 MG/3ML) 0.083% IN NEBU: 2.5000 mg | INHALATION_SOLUTION | RESPIRATORY_TRACT | Status: DC | PRN

## 2019-01-14 MED ORDER — ACETAMINOPHEN 325 MG PO TABS: 650.0000 mg | ORAL_TABLET | Freq: Four times a day (QID) | ORAL | Status: DC | PRN

## 2019-01-14 MED ORDER — ACETAMINOPHEN 650 MG RE SUPP: 650.0000 mg | Freq: Four times a day (QID) | RECTAL | Status: DC | PRN

## 2019-01-14 MED ORDER — MORPHINE SULFATE (PF) 2 MG/ML IV SOLN
2.0000 mg | Freq: Once | INTRAVENOUS | Status: AC
  Administered 2019-01-14: 08:00:00 2 mg via INTRAVENOUS
  Filled 2019-01-14: qty 1

## 2019-01-14 NOTE — H&P (Signed)
[facility] [place] NAME: Jackie Oneal    MR#:  [id number]  DATE OF BIRTH:  1946-02-08  DATE OF ADMISSION:  01/14/2019  PRIMARY CARE PHYSICIAN: Pleas Koch, NP   REQUESTING/REFERRING PHYSICIAN: Dr. Corky Downs  CHIEF COMPLAINT:   Chief Complaint  Patient presents with  . Dizziness    HISTORY OF PRESENT ILLNESS:  Jackie Oneal  is a 73 y.o. female with a known history of osteoporosis, cataracts, arthritis presents to the emergency room complaining of acute onset of chest and back and epigastric pain.  Patient had burning sensation.  On arrival to emergency room she had a CT of chest and abdomen which showed no dissection.  No clots.  Patient is feeling much improved.  She did not have any shortness of breath or palpitations or syncope.  Does have symptoms of GERD which respond to Tums.  Here her EKG and troponins have been normal.  Admitted to rule out acute coronary syndrome.  No arrhythmias.  PAST MEDICAL HISTORY:   Past Medical History:  Diagnosis Date  . Arthritis   . Cataract 2019   resolved with surgery by Dr. George Ina  . Chicken pox   . Osteoporosis   . Vertigo     PAST SURGICAL HISTORY:   Past Surgical History:  Procedure Laterality Date  . CATARACT EXTRACTION W/PHACO Left 03/18/2017   Procedure: CATARACT EXTRACTION PHACO AND INTRAOCULAR LENS PLACEMENT (IOC);  Surgeon: Birder Robson, MD;  Location: [facility] ORS;  Service: Ophthalmology;  Laterality: Left;  [place] 01:23.9 AP% 20.9 CDE 17.58 Fluid Pack lot # [id number]  . CATARACT EXTRACTION W/PHACO Right 11/25/2017   Procedure: CATARACT EXTRACTION PHACO AND INTRAOCULAR LENS PLACEMENT (IOC);  Surgeon: Birder Robson, MD;  Location: [facility] ORS;  Service: Ophthalmology;  Laterality: Right;  [place] 00:41.5 AP% 16.7 CDE 6.95 Fluid Pack Lot # [id number]  . EYE SURGERY Left 03/2016   retina    SOCIAL HISTORY:   Social History   Tobacco Use  . Smoking status: Never Smoker  .  Smokeless tobacco: Never Used  Substance Use Topics  . Alcohol use: Yes    Alcohol/week: 0.0 standard drinks    Comment: social    FAMILY HISTORY:   Family History  Problem Relation Age of Onset  . Arthritis Mother     DRUG ALLERGIES:   Allergies  Allergen Reactions  . Sulfa Antibiotics Other (See Comments)    Childhood allergy    REVIEW OF SYSTEMS:   Review of Systems  Constitutional: Positive for malaise/fatigue. Negative for chills and fever.  HENT: Negative for sore throat.   Eyes: Negative for blurred vision, double vision and pain.  Respiratory: Negative for cough, hemoptysis, shortness of breath and wheezing.   Cardiovascular: Positive for chest pain. Negative for palpitations, orthopnea and leg swelling.  Gastrointestinal: Positive for abdominal pain. Negative for constipation, diarrhea, heartburn, nausea and vomiting.  Genitourinary: Negative for dysuria and hematuria.  Musculoskeletal: Negative for back pain and joint pain.  Skin: Negative for rash.  Neurological: Negative for sensory change, speech change, focal weakness and headaches.  Endo/Heme/Allergies: Does not bruise/bleed easily.  Psychiatric/Behavioral: Negative for depression. The patient is not nervous/anxious.     MEDICATIONS AT HOME:   Prior to Admission medications   Medication Sig Start Date End Date Taking? Authorizing Provider  Calcium Carbonate-Vitamin D (CALCIUM-VITAMIN D3 PO) Take 1 tablet by mouth daily.   Yes [provider]  estradiol (ESTRACE) 0.1 MG/GM vaginal cream Insert vaginally 2-3 times  weekly. 09/29/18  Yes Pleas Koch, NP  ibandronate (BONIVA) 150 MG tablet Take one tablet by mouth once monthly in the morning with water. No food or other medications for 30 min. Do not lay flat for 1 hour. 08/04/18  Yes Pleas Koch, NP  acetaminophen (TYLENOL) 325 MG tablet Take 2 tablets (650 mg total) by mouth every 4 (four) hours as needed for headache or mild pain. 03/20/18    Vaughan Basta, MD  lactase (LACTAID) 3000 units tablet Take 3,000 Units by mouth daily as needed (for lactose intolerant).     [provider]  naproxen sodium (ALEVE) 220 MG tablet Take 220 mg by mouth daily as needed (for pain or headache).    [provider]     VITAL SIGNS:  Blood pressure 105/62, pulse 69, temperature 97.9 F (36.6 C), temperature source Oral, resp. rate 16, height 5\' 2"  (1.575 m), weight 68 kg, SpO2 100 %.  PHYSICAL EXAMINATION:  Physical Exam  GENERAL:  73 y.o.-year-old patient lying in the bed with no acute distress.  EYES: Pupils equal, round, reactive to light and accommodation. No scleral icterus. Extraocular muscles intact.  HEENT: Head atraumatic, normocephalic. Oropharynx and nasopharynx clear. No oropharyngeal erythema, moist oral mucosa  NECK:  Supple, no jugular venous distention. No thyroid enlargement, no tenderness.  LUNGS: Normal breath sounds bilaterally, no wheezing, rales, rhonchi. No use of accessory muscles of respiration.  CARDIOVASCULAR: S1, S2 normal. No murmurs, rubs, or gallops.  ABDOMEN: Soft, nontender, nondistended. Bowel sounds present. No organomegaly or mass.  EXTREMITIES: No pedal edema, cyanosis, or clubbing. + 2 pedal & radial pulses b/l.   NEUROLOGIC: Cranial nerves II through XII are intact. No focal Motor or sensory deficits appreciated b/l PSYCHIATRIC: The patient is alert and oriented x 3. Good affect.  SKIN: No obvious rash, lesion, or ulcer.   LABORATORY PANEL:   CBC Recent Labs  Lab 01/14/19 0341  WBC 8.5  HGB 12.4  HCT 37.5  PLT 242   ------------------------------------------------------------------------------------------------------------------  Chemistries  Recent Labs  Lab 01/14/19 0341  NA 140  K 3.2*  CL 103  CO2 25  GLUCOSE 117*  BUN 14  CREATININE 0.81  CALCIUM 9.4  AST 21  ALT 15  ALKPHOS 51  BILITOT 0.9    ------------------------------------------------------------------------------------------------------------------  Cardiac Enzymes Recent Labs  Lab 01/14/19 1022  TROPONINI <0.03   ------------------------------------------------------------------------------------------------------------------  RADIOLOGY:  Ct Angio Chest/abd/pel For Dissection W And/or Wo Contrast  Result Date: 01/14/2019 CLINICAL DATA:  Dizziness, shaking, chest pain. Acute aortic syndrome suspected. EXAM: CT ANGIOGRAPHY CHEST, ABDOMEN AND PELVIS TECHNIQUE: Multidetector CT imaging through the chest, abdomen and pelvis was performed using the standard protocol during bolus administration of intravenous contrast. Multiplanar reconstructed images and MIPs were obtained and reviewed to evaluate the vascular anatomy. CONTRAST:  131mL ISOVUE-370 IOPAMIDOL (ISOVUE-370) INJECTION 76% COMPARISON:  Chest CT angiogram dated 03/19/2018. FINDINGS: CTA CHEST FINDINGS Cardiovascular: Mild atherosclerosis at the aortic arch. No thoracic aortic aneurysm or dissection. Heart size is normal. No pericardial effusion. No pulmonary embolism identified within the main, lobar or central segmental pulmonary arteries. Mediastinum/Nodes: No mass or enlarged lymph nodes seen within the mediastinum or perihilar regions. Esophagus is unremarkable. Trachea and central bronchi are unremarkable. Lungs/Pleura: Lungs are clear. No pleural effusion or pneumothorax. Musculoskeletal: No acute or suspicious osseous finding. Review of the MIP images confirms the above findings. CTA ABDOMEN AND PELVIS FINDINGS VASCULAR Aorta: Normal caliber aorta without aneurysm, dissection, vasculitis or significant stenosis. Celiac: Patent without  evidence of aneurysm, dissection, vasculitis or significant stenosis. SMA: Patent without evidence of aneurysm, dissection, vasculitis or significant stenosis. Renals: Both renal arteries are patent without evidence of aneurysm,  dissection, vasculitis, fibromuscular dysplasia or significant stenosis. IMA: Patent without evidence of aneurysm, dissection, vasculitis or significant stenosis. Inflow: Patent without evidence of aneurysm, dissection, vasculitis or significant stenosis. Veins: No obvious venous abnormality within the limitations of this arterial phase study. Review of the MIP images confirms the above findings. NON-VASCULAR Hepatobiliary: No focal liver abnormality is seen. No gallstones, gallbladder wall thickening, or biliary dilatation. Pancreas: Unremarkable. No pancreatic ductal dilatation or surrounding inflammatory changes. Spleen: Normal in size without focal abnormality. Adrenals/Urinary Tract: LEFT renal cysts, largest measuring 4 cm. Nonobstructing LEFT renal calculus measures 6 mm. No hydronephrosis bilaterally. No perinephric fluid. No ureteral or bladder calculi identified. Bladder appears normal, partially decompressed. Stomach/Bowel: No dilated large or small bowel loops. No evidence of bowel wall inflammation seen. Appendix is normal. Stomach is unremarkable. Lymphatic: No enlarged lymph nodes identified. Reproductive: Uterus and bilateral adnexa are unremarkable. Other: No free fluid or abscess collection identified. No free intraperitoneal air. Musculoskeletal: No acute or suspicious osseous finding. Mild disc bulge at the lumbosacral junction. Review of the MIP images confirms the above findings. IMPRESSION: 1. No acute findings within the chest, abdomen or pelvis. No thoracic or abdominal aortic aneurysm or dissection. No pulmonary embolism seen. Lungs are clear. No evidence of acute solid organ abnormality. No bowel obstruction or evidence of bowel wall inflammation. Appendix is normal. 2. 6 mm nonobstructing left renal stone. Aortic Atherosclerosis (ICD10-I70.0). Electronically Signed   By: Franki Cabot M.D.   On: 01/14/2019 08:30     IMPRESSION AND PLAN:   *Atypical chest pain.  Likely GERD.   Responds to Tums at home sometimes.  CT scan showed nothing acute.  EKG with nothing acute.  Telemetry with no arrhythmias.  If repeat troponins are negative patient will be discharged home.  All the records are reviewed and case discussed with ED provider. Management plans discussed with the patient, family and they are in agreement.  CODE STATUS: FULL CODE  TOTAL TIME TAKING CARE OF THIS PATIENT: 40 minutes.   Neita Carp M.D on 01/14/2019 at 4:34 PM  Between 7am to 6pm - Pager - [phone]  After 6pm go to [email or web address] - password [email or web address] [facility]  Office  [phone]  CC: Primary care physician; Pleas Koch, NP  Note: This dictation was prepared with Dragon dictation along with smaller phrase technology. Any transcriptional errors that result from this process are unintentional.

## 2019-01-14 NOTE — ED Triage Notes (Signed)
Pt to triage via w/c, mask in place; appears anxious; pt reports awoke PTA feeling dizzy, shaky and "entire torso hurting"; pt denies hx of same

## 2019-01-14 NOTE — ED Provider Notes (Signed)
[facility] Emergency Department Provider Note   ____________________________________________    I have reviewed the triage vital signs and the nursing notes.   HISTORY  Chief Complaint Dizziness     HPI Kaleisha Bhargava is a 73 y.o. female who presents with complaints of relatively abrupt onset of "torso pain ".  Patient reports this woke her up early this morning, it is abated somewhat but she continues to feel discomfort in her chest and intermittent episodes of significant pain in her epigastrium.  She also has discomfort in her back.  When this occurred she felt lightheaded.  No history of heart disease.  Has not had similar pain before.  No fevers or chills or myalgias.  No cough.  No shortness of breath.  No pleurisy.  Has not take anything for this.  Past Medical History:  Diagnosis Date  . Arthritis   . Cataract 2019   resolved with surgery by Dr. George Ina  . Chicken pox   . Osteoporosis   . Vertigo     Patient Active Problem List   Diagnosis Date Noted  . Female dyspareunia 09/29/2018  . Syncope and collapse 03/19/2018  . Preventative health care 05/07/2017  . Medicare annual wellness visit, subsequent 12/26/2015  . Hyperlipidemia 12/26/2015  . Vitamin D deficiency 12/26/2015  . Osteoporosis 06/21/2015  . Arthritis 06/21/2015  . Benign paroxysmal positional vertigo 06/21/2015    Past Surgical History:  Procedure Laterality Date  . CATARACT EXTRACTION W/PHACO Left 03/18/2017   Procedure: CATARACT EXTRACTION PHACO AND INTRAOCULAR LENS PLACEMENT (IOC);  Surgeon: Birder Robson, MD;  Location: [facility] ORS;  Service: Ophthalmology;  Laterality: Left;  [place] 01:23.9 AP% 20.9 CDE 17.58 Fluid Pack lot # [id number] H  . CATARACT EXTRACTION W/PHACO Right 11/25/2017   Procedure: CATARACT EXTRACTION PHACO AND INTRAOCULAR LENS PLACEMENT (IOC);  Surgeon: Birder Robson, MD;  Location: [facility] ORS;  Service: Ophthalmology;  Laterality: Right;  [place]  00:41.5 AP% 16.7 CDE 6.95 Fluid Pack Lot # [id number]  . EYE SURGERY Left 03/2016   retina    Prior to Admission medications   Medication Sig Start Date End Date Taking? Authorizing Provider  acetaminophen (TYLENOL) 325 MG tablet Take 2 tablets (650 mg total) by mouth every 4 (four) hours as needed for headache or mild pain. 03/20/18   Vaughan Basta, MD  Calcium Carbonate-Vitamin D (CALCIUM-VITAMIN D3 PO) Take 1 tablet by mouth daily.    [provider]  estradiol (ESTRACE) 0.1 MG/GM vaginal cream Insert vaginally 2-3 times weekly. 09/29/18   Pleas Koch, NP  ibandronate (BONIVA) 150 MG tablet Take one tablet by mouth once monthly in the morning with water. No food or other medications for 30 min. Do not lay flat for 1 hour. 08/04/18   Pleas Koch, NP  lactase (LACTAID) 3000 units tablet Take 3,000 Units by mouth daily as needed (for lactose intolerant).     [provider]  naproxen sodium (ALEVE) 220 MG tablet Take 220 mg by mouth daily as needed (for pain or headache).    [provider]     Allergies Sulfa antibiotics  Family History  Problem Relation Age of Onset  . Arthritis Mother     Social History Social History   Tobacco Use  . Smoking status: Never Smoker  . Smokeless tobacco: Never Used  Substance Use Topics  . Alcohol use: Yes    Alcohol/week: 0.0 standard drinks    Comment: social  . Drug use: No  Review of Systems  Constitutional: No fever/chills Eyes: No visual changes.  ENT: Initial discomfort was in the neck Cardiovascular: As above Respiratory: Denies shortness of breath. Gastrointestinal: As above Genitourinary: Negative for dysuria. Musculoskeletal: As above Skin: Negative for rash. Neurological: Negative for headaches    ____________________________________________   PHYSICAL EXAM:  VITAL SIGNS: ED Triage Vitals  Enc Vitals Group     BP 01/14/19 0328 (!) 115/52     Pulse Rate 01/14/19 0328  78     Resp 01/14/19 0328 18     Temp 01/14/19 0328 97.8 F (36.6 C)     Temp Source 01/14/19 0328 Oral     SpO2 01/14/19 0328 100 %     Weight 01/14/19 0328 68 kg (150 lb)     Height 01/14/19 0328 1.575 m (5\' 2" )     Head Circumference --      Peak Flow --      Pain Score 01/14/19 0324 6     Pain Loc --      Pain Edu? --      Excl. in [place]? --     Constitutional: Alert and oriented. No acute distress.  Eyes: Conjunctivae are normal.   Nose: No congestion/rhinnorhea. Mouth/Throat: Mucous membranes are moist.   Neck:  Painless ROM Cardiovascular: Normal rate, regular rhythm.   Good peripheral circulation. Respiratory: Normal respiratory effort.  No retractions.  Gastrointestinal: Soft and nontender. No distention.  No CVA tenderness.  Musculoskeletal: No lower extremity tenderness nor edema.  Warm and well perfused.  Equal pulses in upper and lower extremities Neurologic:  Normal speech and language. No gross focal neurologic deficits are appreciated.  Skin:  Skin is warm, dry and intact. No rash noted. Psychiatric: Mood and affect are normal. Speech and behavior are normal.  ____________________________________________   LABS (all labs ordered are listed, but only abnormal results are displayed)  Labs Reviewed  CBC WITH DIFFERENTIAL/PLATELET - Abnormal; Notable for the following components:      Result Value   Lymphs Abs 4.4 (*)    All other components within normal limits  COMPREHENSIVE METABOLIC PANEL - Abnormal; Notable for the following components:   Potassium 3.2 (*)    Glucose, Bld 117 (*)    All other components within normal limits  URINALYSIS, COMPLETE (UACMP) WITH MICROSCOPIC - Abnormal; Notable for the following components:   Color, Urine YELLOW (*)    APPearance CLOUDY (*)    All other components within normal limits  LIPASE, BLOOD - Abnormal; Notable for the following components:   Lipase 52 (*)    All other components within normal limits  NOVEL  CORONAVIRUS, NAA (HOSPITAL ORDER, SEND-OUT TO REF LAB)  TROPONIN I  GLUCOSE, CAPILLARY  CBG MONITORING, ED   ____________________________________________  EKG  ED ECG REPORT I, Lavonia Drafts, the attending physician, personally viewed and interpreted this ECG.  Date: 01/14/2019  Rhythm: normal sinus rhythm QRS Axis: normal Intervals: normal ST/T Wave abnormalities: normal Narrative Interpretation: no evidence of acute ischemia  ____________________________________________  RADIOLOGY  CT angiography is overall unremarkable, discussed incidental findings with the patient ____________________________________________   PROCEDURES  Procedure(s) performed: No  Procedures   Critical Care performed: No ____________________________________________   INITIAL IMPRESSION / ASSESSMENT AND PLAN / ED COURSE  Pertinent labs & imaging results that were available during my care of the patient were reviewed by me and considered in my medical decision making (see chart for details).  Patient presents with relatively abrupt onset of torso pain as  above, she described as quite severe including the chest back and abdomen.  Although she is not tachycardic and her labs are overall reassuring description could be consistent with an aortic dissection.  She also describes a bandlike pain in her epigastrium which seems to be where the majority of her pain is so gastritis/esophagitis is also a possibility .we will treat with IV morphine, IV Zofran, IV fluids and sent for CT angiography  She required additional dose of IV morphine for continued discomfort  CT scan is quite reassuring however given her continued discomfort will discuss with the hospitalist for admission  Dr. Tressia Miners has accepted the patient    ____________________________________________   FINAL CLINICAL IMPRESSION(S) / ED DIAGNOSES  Final diagnoses:  Chest pain, unspecified type  Lower abdominal pain  Dizziness         Note:  This document was prepared using Dragon voice recognition software and may include unintentional dictation errors.   Lavonia Drafts, MD 01/14/19 [phone]

## 2019-01-14 NOTE — TOC Initial Note (Signed)
Transition of Care [facility]) - Initial/Assessment Note    Patient Details  Name: Jackie Oneal MRN: [id number] Date of Birth: Feb 03, 1946  [facility] CM/SW Contact:    Marshell Garfinkel, RN Phone Number: 01/14/2019, 11:09 AM  Clinical Narrative:                 No RNCM needs identified. Current with PCP. No medication issues. Independent from home; drives.      Patient Goals and CMS Choice        Expected Discharge Plan and Services                                                Prior Living Arrangements/Services                       Activities of Daily Living      Permission Sought/Granted                  Emotional Assessment              Admission diagnosis:  Chest Pain Patient Active Problem List   Diagnosis Date Noted  . Chest pain 01/14/2019  . Female dyspareunia 09/29/2018  . Syncope and collapse 03/19/2018  . Preventative health care 05/07/2017  . Medicare annual wellness visit, subsequent 12/26/2015  . Hyperlipidemia 12/26/2015  . Vitamin D deficiency 12/26/2015  . Osteoporosis 06/21/2015  . Arthritis 06/21/2015  . Benign paroxysmal positional vertigo 06/21/2015   PCP:  Pleas Koch, NP Pharmacy:   [facility] [place] [address], [place] [address] Phone: [phone] Fax: [phone]  [facility] [place], [place] [address] Phone: [phone] Fax: [phone]     Social Determinants of Health (SDOH) Interventions    Readmission Risk Interventions No flowsheet data found.

## 2019-01-14 NOTE — Care Management Obs Status (Signed)
[facility] NOTIFICATION   Patient Details  Name: Jackie Oneal MRN: [id number] Date of Birth: [birth year]   Medicare Observation Status Notification Given:  Yes    Marshell Garfinkel, RN 01/14/2019, 11:05 AM

## 2019-01-14 NOTE — ED Notes (Signed)
Pt c/o upper abd and chest pain that woke her at 3am this morning. States it comes in waves like a band wrapped around. States she gets SOB and dizzy with the pain. Denies N/V.Marland Kitchen

## 2019-01-14 NOTE — ED Notes (Signed)
ED TO INPATIENT HANDOFF REPORT  ED Nurse Name and Phone #: Ignatius Specking [phone]  S Name/Age/Gender Jackie Oneal 73 y.o. female Room/Bed: ED15A/ED15A  Code Status   Code Status: Full Code  Home/SNF/Other Home Patient oriented to: self, place, time and situation Is this baseline? Yes   Triage Complete: Triage complete  Chief Complaint Chest Pain  Triage Note Pt to triage via w/c, mask in place; appears anxious; pt reports awoke PTA feeling dizzy, shaky and "entire torso hurting"; pt denies hx of same   Allergies Allergies  Allergen Reactions  . Sulfa Antibiotics Other (See Comments)    Childhood allergy    Level of Care/Admitting Diagnosis ED Disposition    ED Disposition Condition [facility]: [place] [[id number]]  Level of Care: Telemetry [5]  Covid Evaluation: Screening Protocol (No Symptoms)  Diagnosis: Chest pain [701779]  Admitting Physician: Hillary Bow [[id number]]  Attending Physician: Hillary Bow [[id number]  PT Class (Do Not Modify): Observation [104]  PT Acc Code (Do Not Modify): Observation [10022]       B Medical/Surgery History Past Medical History:  Diagnosis Date  . Arthritis   . Cataract 2019   resolved with surgery by Dr. George Ina  . Chicken pox   . Osteoporosis   . Vertigo    Past Surgical History:  Procedure Laterality Date  . CATARACT EXTRACTION W/PHACO Left 03/18/2017   Procedure: CATARACT EXTRACTION PHACO AND INTRAOCULAR LENS PLACEMENT (IOC);  Surgeon: Birder Robson, MD;  Location: [facility] ORS;  Service: Ophthalmology;  Laterality: Left;  [place] 01:23.9 AP% 20.9 CDE 17.58 Fluid Pack lot # [id number]  . CATARACT EXTRACTION W/PHACO Right 11/25/2017   Procedure: CATARACT EXTRACTION PHACO AND INTRAOCULAR LENS PLACEMENT (IOC);  Surgeon: Birder Robson, MD;  Location: [facility] ORS;  Service: Ophthalmology;  Laterality: Right;  [place] 00:41.5 AP% 16.7 CDE 6.95 Fluid Pack Lot # [id number]  . EYE SURGERY  Left 03/2016   retina     A IV Location/Drains/Wounds Patient Lines/Drains/Airways Status   Active Line/Drains/Airways    Name:   Placement date:   Placement time:   Site:   Days:   Peripheral IV 01/14/19 Left Antecubital   01/14/19    0733    Antecubital   less than 1   Airway   03/18/17    1222     667   Incision (Closed) 03/18/17 Eye Left   03/18/17    1110     667   Incision (Closed) 11/25/17 Eye Right   11/25/17    0736     415          Intake/Output Last 24 hours  Intake/Output Summary (Last 24 hours) at 01/14/2019 1009 Last data filed at 01/14/2019 0951 Gross per 24 hour  Intake 1000 ml  Output no documentation  Net 1000 ml    Labs/Imaging Results for orders placed or performed during the hospital encounter of 01/14/19 (from the past 48 hour(s))  CBC with Differential     Status: Abnormal   Collection Time: 01/14/19  3:41 AM  Result Value Ref Range   WBC 8.5 4.0 - 10.5 K/uL   RBC 4.01 3.87 - 5.11 MIL/uL   Hemoglobin 12.4 12.0 - 15.0 g/dL   HCT 37.5 36.0 - 46.0 %   MCV 93.5 80.0 - 100.0 fL   MCH 30.9 26.0 - 34.0 pg   MCHC 33.1 30.0 - 36.0 g/dL   RDW 12.7 11.5 - 15.5 %   Platelets 242 150 -  400 K/uL   nRBC 0.0 0.0 - 0.2 %   Neutrophils Relative % 42 %   Neutro Abs 3.5 1.7 - 7.7 K/uL   Lymphocytes Relative 51 %   Lymphs Abs 4.4 (H) 0.7 - 4.0 K/uL   Monocytes Relative 5 %   Monocytes Absolute 0.4 0.1 - 1.0 K/uL   Eosinophils Relative 2 %   Eosinophils Absolute 0.1 0.0 - 0.5 K/uL   Basophils Relative 0 %   Basophils Absolute 0.0 0.0 - 0.1 K/uL   Immature Granulocytes 0 %   Abs Immature Granulocytes 0.01 0.00 - 0.07 K/uL    Comment: Performed at [facility], [address] [place], [place] [address]  Comprehensive metabolic panel     Status: Abnormal   Collection Time: 01/14/19  3:41 AM  Result Value Ref Range   Sodium 140 135 - 145 mmol/L   Potassium 3.2 (L) 3.5 - 5.1 mmol/L   Chloride 103 98 - 111 mmol/L   CO2 25 22 - 32 mmol/L   Glucose,  Bld 117 (H) 70 - 99 mg/dL   BUN 14 8 - 23 mg/dL   Creatinine, Ser 0.81 0.44 - 1.00 mg/dL   Calcium 9.4 8.9 - 10.3 mg/dL   Total Protein 7.1 6.5 - 8.1 g/dL   Albumin 4.5 3.5 - 5.0 g/dL   AST 21 15 - 41 U/L   ALT 15 0 - 44 U/L   Alkaline Phosphatase 51 38 - 126 U/L   Total Bilirubin 0.9 0.3 - 1.2 mg/dL   GFR calc non Af Amer >60 >60 mL/min   GFR calc Af Amer >60 >60 mL/min   Anion gap 12 5 - 15    Comment: Performed at [facility], [address] [place], [place] [address]  Troponin I - ONCE - STAT     Status: None   Collection Time: 01/14/19  3:41 AM  Result Value Ref Range   Troponin I <0.03 <0.03 ng/mL    Comment: Performed at [facility], [address] [place], [place] [address]  Urinalysis, Complete w Microscopic     Status: Abnormal   Collection Time: 01/14/19  3:41 AM  Result Value Ref Range   Color, Urine YELLOW (A) YELLOW   APPearance CLOUDY (A) CLEAR   Specific Gravity, Urine 1.012 1.005 - 1.030   pH 7.0 5.0 - 8.0   Glucose, UA NEGATIVE NEGATIVE mg/dL   Hgb urine dipstick NEGATIVE NEGATIVE   Bilirubin Urine NEGATIVE NEGATIVE   Ketones, ur NEGATIVE NEGATIVE mg/dL   Protein, ur NEGATIVE NEGATIVE mg/dL   Nitrite NEGATIVE NEGATIVE   Leukocytes,Ua NEGATIVE NEGATIVE   WBC, UA 0-5 0 - 5 WBC/hpf   Bacteria, UA NONE SEEN NONE SEEN   Squamous Epithelial / LPF 0-5 0 - 5   Mucus PRESENT    Amorphous Crystal PRESENT     Comment: Performed at [facility], [address] [place], [place] [address]  Lipase, blood     Status: Abnormal   Collection Time: 01/14/19  3:41 AM  Result Value Ref Range   Lipase 52 (H) 11 - 51 U/L    Comment: Performed at [facility], [address] [place], [place] [address]  Glucose, capillary     Status: None   Collection Time: 01/14/19  3:52 AM  Result Value Ref Range   Glucose-Capillary 97 70 - 99 mg/dL   Ct Angio Chest/abd/pel For Dissection W And/or Wo Contrast  Result Date: 01/14/2019 CLINICAL  DATA:  Dizziness, shaking, chest pain. Acute aortic syndrome suspected. EXAM: CT ANGIOGRAPHY CHEST, ABDOMEN AND PELVIS TECHNIQUE: Multidetector CT imaging through the chest, abdomen and pelvis was performed using the standard protocol during bolus administration of intravenous contrast. Multiplanar reconstructed images and MIPs were obtained and reviewed to evaluate the vascular anatomy. CONTRAST:  189mL ISOVUE-370 IOPAMIDOL (ISOVUE-370) INJECTION 76% COMPARISON:  Chest CT angiogram dated 03/19/2018. FINDINGS: CTA CHEST FINDINGS Cardiovascular: Mild atherosclerosis at the aortic arch. No thoracic aortic aneurysm or dissection. Heart size is normal. No pericardial effusion. No pulmonary embolism identified within the main, lobar or central segmental pulmonary arteries. Mediastinum/Nodes: No mass or enlarged lymph nodes seen within the mediastinum or perihilar regions. Esophagus is unremarkable. Trachea and central bronchi are unremarkable. Lungs/Pleura: Lungs are clear. No pleural effusion or pneumothorax. Musculoskeletal: No acute or suspicious osseous finding. Review of the MIP images confirms the above findings. CTA ABDOMEN AND PELVIS FINDINGS VASCULAR Aorta: Normal caliber aorta without aneurysm, dissection, vasculitis or significant stenosis. Celiac: Patent without evidence of aneurysm, dissection, vasculitis or significant stenosis. SMA: Patent without evidence of aneurysm, dissection, vasculitis or significant stenosis. Renals: Both renal arteries are patent without evidence of aneurysm, dissection, vasculitis, fibromuscular dysplasia or significant stenosis. IMA: Patent without evidence of aneurysm, dissection, vasculitis or significant stenosis. Inflow: Patent without evidence of aneurysm, dissection, vasculitis or significant stenosis. Veins: No obvious venous abnormality within the limitations of this arterial phase study. Review of the MIP images confirms the above findings. NON-VASCULAR Hepatobiliary: No  focal liver abnormality is seen. No gallstones, gallbladder wall thickening, or biliary dilatation. Pancreas: Unremarkable. No pancreatic ductal dilatation or surrounding inflammatory changes. Spleen: Normal in size without focal abnormality. Adrenals/Urinary Tract: LEFT renal cysts, largest measuring 4 cm. Nonobstructing LEFT renal calculus measures 6 mm. No hydronephrosis bilaterally. No perinephric fluid. No ureteral or bladder calculi identified. Bladder appears normal, partially decompressed. Stomach/Bowel: No dilated large or small bowel loops. No evidence of bowel wall inflammation seen. Appendix is normal. Stomach is unremarkable. Lymphatic: No enlarged lymph nodes identified. Reproductive: Uterus and bilateral adnexa are unremarkable. Other: No free fluid or abscess collection identified. No free intraperitoneal air. Musculoskeletal: No acute or suspicious osseous finding. Mild disc bulge at the lumbosacral junction. Review of the MIP images confirms the above findings. IMPRESSION: 1. No acute findings within the chest, abdomen or pelvis. No thoracic or abdominal aortic aneurysm or dissection. No pulmonary embolism seen. Lungs are clear. No evidence of acute solid organ abnormality. No bowel obstruction or evidence of bowel wall inflammation. Appendix is normal. 2. 6 mm nonobstructing left renal stone. Aortic Atherosclerosis (ICD10-I70.0). Electronically Signed   By: Franki Cabot M.D.   On: 01/14/2019 08:30    Pending Labs Unresulted Labs (From admission, onward)    Start     Ordered   01/21/19 0500  Creatinine, serum  (enoxaparin (LOVENOX)    CrCl >/= 30 ml/min)  Weekly,   STAT    Comments: while on enoxaparin therapy    01/14/19 1004   01/15/19 6440  Basic metabolic panel  Tomorrow morning,   STAT     01/14/19 1004   01/15/19 0500  CBC  Tomorrow morning,   STAT     01/14/19 1004   01/14/19 1200  Troponin I - Now Then Q6H  Now then every 6 hours,   STAT     01/14/19 1007   01/14/19 1001   CBC  (enoxaparin (LOVENOX)    CrCl >/= 30 ml/min)  Once,   STAT    Comments:  Baseline for enoxaparin therapy IF NOT ALREADY DRAWN.  Notify MD if PLT < 100 K.    01/14/19 1004   01/14/19 0849  Novel Coronavirus,NAA,(SEND-OUT TO REF LAB - TAT 24-48 hrs); Hosp Order  (Asymptomatic Patients Labs)  Once,   STAT    Question:  Rule Out  Answer:  Yes   01/14/19 0849          Vitals/Pain Today's Vitals   01/14/19 0830 01/14/19 0845 01/14/19 0900 01/14/19 0952  BP: 109/62  108/63   Pulse: 71 78 74   Resp:      Temp:      TempSrc:      SpO2: 99% 100% 99%   Weight:      Height:      PainSc:    6     Isolation Precautions No active isolations  Medications Medications  enoxaparin (LOVENOX) injection 40 mg (has no administration in time range)  sodium chloride flush (NS) 0.9 % injection 3 mL (has no administration in time range)  acetaminophen (TYLENOL) tablet 650 mg (has no administration in time range)    Or  acetaminophen (TYLENOL) suppository 650 mg (has no administration in time range)  ibuprofen (ADVIL) tablet 400 mg (has no administration in time range)  polyethylene glycol (MIRALAX / GLYCOLAX) packet 17 g (has no administration in time range)  ondansetron (ZOFRAN) tablet 4 mg (has no administration in time range)    Or  ondansetron (ZOFRAN) injection 4 mg (has no administration in time range)  albuterol (PROVENTIL) (2.5 MG/3ML) 0.083% nebulizer solution 2.5 mg (has no administration in time range)  aspirin EC tablet 81 mg (has no administration in time range)  0.9 %  sodium chloride infusion (0 mLs Intravenous Stopped 01/14/19 0951)  morphine 2 MG/ML injection 2 mg (2 mg Intravenous Given 01/14/19 0734)  ondansetron (ZOFRAN) injection 4 mg (4 mg Intravenous Given 01/14/19 0734)  iopamidol (ISOVUE-370) 76 % injection 100 mL (100 mLs Intravenous Contrast Given 01/14/19 0739)  morphine 2 MG/ML injection 2 mg (2 mg Intravenous Given 01/14/19 0825)    Mobility walks Low fall risk    Focused Assessments Cardiac Assessment Handoff:    Lab Results  Component Value Date   TROPONINI <0.03 01/14/2019   No results found for: DDIMER Does the Patient currently have chest pain? Yes     R Recommendations: See Admitting Provider Note  Report given to:   Additional Notes: na

## 2019-01-14 NOTE — ED Notes (Signed)
Walking covid swab to lab.

## 2019-01-14 NOTE — Progress Notes (Signed)
Talked to Dr. Darvin Neighbours about her last troponin being negative, order to discharge.   Went over discharge instructions with the patient, about follow-up appointment. No changes made with her medication. Advice to call 911 if having chest pain. Discontinue peripheral IV and telemetry monitor.

## 2019-01-15 ENCOUNTER — Telehealth: Payer: Self-pay

## 2019-01-15 LAB — NOVEL CORONAVIRUS, NAA (HOSP ORDER, SEND-OUT TO REF LAB; TAT 18-24 HRS): SARS-CoV-2, NAA: NOT DETECTED

## 2019-01-15 NOTE — Telephone Encounter (Signed)
Attempted to contact patient to complete TCM and confirm hosp f/u.

## 2019-01-21 ENCOUNTER — Ambulatory Visit (INDEPENDENT_AMBULATORY_CARE_PROVIDER_SITE_OTHER): Payer: PPO | Admitting: Primary Care

## 2019-01-21 DIAGNOSIS — Z09 Encounter for follow-up examination after completed treatment for conditions other than malignant neoplasm: Secondary | ICD-10-CM

## 2019-01-21 DIAGNOSIS — M810 Age-related osteoporosis without current pathological fracture: Secondary | ICD-10-CM

## 2019-01-21 DIAGNOSIS — K219 Gastro-esophageal reflux disease without esophagitis: Secondary | ICD-10-CM | POA: Insufficient documentation

## 2019-01-21 HISTORY — DX: Gastro-esophageal reflux disease without esophagitis: K21.9

## 2019-01-21 NOTE — Assessment & Plan Note (Signed)
Admitted for chest pressure, epigastric "band like" pressure. AAA and acute ischemia ruled out. No arrhythmias during telemetry monitoring.  Do agree that symptoms are likely GERD related and that Jackie Oneal has a lot to due with the cause.  Stop Boniva, see notes under osteoporosis. Use Tums PRN.  Return precautions provided. Hospital notes, labs, and imaging reviewed.

## 2019-01-21 NOTE — Assessment & Plan Note (Signed)
Cannot tolerate side effects of Boniva, kindly declines Prolia injections.  She will remain compliant to her calcium and vitamin D, she will also be sure to get plenty of weight bearing exercise.   Continue to monitor.

## 2019-01-21 NOTE — Progress Notes (Signed)
Subjective:    Patient ID: Jackie Oneal, female    DOB: Sep 03, 1945, 73 y.o.   MRN: [id number]  HPI  Virtual Visit via Video Note  I connected with Jackie Oneal on 01/21/19 at  9:20 AM EDT by a video enabled telemedicine application and verified that I am speaking with the correct person using two identifiers.  Location: Patient: Home Provider: Office   I discussed the limitations of evaluation and management by telemedicine and the availability of in person appointments. The patient expressed understanding and agreed to proceed.  History of Present Illness:  Jackie Oneal is a 73 year old female who presents today for hospital follow up.  She presented to [facility] ED on 01/14/19 with a chief complaint of "torso pain" that began earlier that morning. She also described intermittent chest discomfort to the epigastrium, described it like a "band-like" pressure. Work up in the ED consisted of CTA chest to rule out aortic dissection which was unremarkable. Lab work and ECG grossly unremarkable. Given her age and continued discomfort she was admitted for further evaluation.  During her hospital stay she was monitored in a telemetry unit which was without arrhythmia. Serial troponin levels were negative. Her symptoms were thought to be GERD related and she was discharged home the following day.  Since her discharge home she's doing well. She denies chest pain, shortness of breath. She does have a tremendous amount of esophageal burning and reflux for several days when taking Boniva and would like to stop taking. She tried to bear with the side effects of Boniva for quite some time and cannot tolerate. She is compliant to calcium and vitamin D, she is also very active and gets regular exercise.    Observations/Objective:  Alert and oriented. Appears well, not sickly. No distress. Speaking in complete sentences.   Assessment and Plan:  Admitted for chest pressure, epigastric "band  like" pressure. AAA and acute ischemia ruled out. No arrhythmias during telemetry monitoring.  Do agree that symptoms are likely GERD related and that [name] has a lot to due with the cause.  Stop Boniva, see notes under osteoporosis. Use Tums PRN.  Return precautions provided. Hospital notes, labs, and imaging reviewed.  Follow Up Instructions:  Stop Boniva once you complete your final pill as discussed.  Continue to take 1200 mg of Calcium and at least 800 units of Vitmain D daily.   Be sure to get plenty of weight bearing exercise to improve bone strength.   It was a pleasure to see you today!    I discussed the assessment and treatment plan with the patient. The patient was provided an opportunity to ask questions and all were answered. The patient agreed with the plan and demonstrated an understanding of the instructions.   The patient was advised to call back or seek an in-person evaluation if the symptoms worsen or if the condition fails to improve as anticipated.    Pleas Koch, NP    Review of Systems  Respiratory: Negative for shortness of breath.   Cardiovascular: Negative for chest pain.  Gastrointestinal:       Intermittent GERD  Neurological: Negative for dizziness and headaches.       Past Medical History:  Diagnosis Date  . Arthritis   . Cataract 2019   resolved with surgery by Dr. George Ina  . Chicken pox   . Osteoporosis   . Vertigo      Social History   Socioeconomic History  . Marital status:  Married    Spouse name: Not on file  . Number of children: Not on file  . Years of education: Not on file  . Highest education level: Not on file  Occupational History  . Not on file  Social Needs  . Financial resource strain: Not on file  . Food insecurity    Worry: Not on file    Inability: Not on file  . Transportation needs    Medical: Not on file    Non-medical: Not on file  Tobacco Use  . Smoking status: Never Smoker  . Smokeless  tobacco: Never Used  Substance and Sexual Activity  . Alcohol use: Yes    Alcohol/week: 0.0 standard drinks    Comment: social  . Drug use: No  . Sexual activity: Yes  Lifestyle  . Physical activity    Days per week: Not on file    Minutes per session: Not on file  . Stress: Not on file  Relationships  . Social Herbalist on phone: Not on file    Gets together: Not on file    Attends religious service: Not on file    Active member of club or organization: Not on file    Attends meetings of clubs or organizations: Not on file    Relationship status: Not on file  . Intimate partner violence    Fear of current or ex partner: Not on file    Emotionally abused: Not on file    Physically abused: Not on file    Forced sexual activity: Not on file  Other Topics Concern  . Not on file  Social History Narrative   Married.   2 sons. 3 grandchildren.   Retired. Worked as a Water quality scientist.    Enjoys walking, decorating, reading, sewing.    Past Surgical History:  Procedure Laterality Date  . CATARACT EXTRACTION W/PHACO Left 03/18/2017   Procedure: CATARACT EXTRACTION PHACO AND INTRAOCULAR LENS PLACEMENT (IOC);  Surgeon: Birder Robson, MD;  Location: [facility] ORS;  Service: Ophthalmology;  Laterality: Left;  [place] 01:23.9 AP% 20.9 CDE 17.58 Fluid Pack lot # [id number]  . CATARACT EXTRACTION W/PHACO Right 11/25/2017   Procedure: CATARACT EXTRACTION PHACO AND INTRAOCULAR LENS PLACEMENT (IOC);  Surgeon: Birder Robson, MD;  Location: [facility] ORS;  Service: Ophthalmology;  Laterality: Right;  [place] 00:41.5 AP% 16.7 CDE 6.95 Fluid Pack Lot # [id number]  . EYE SURGERY Left 03/2016   retina    Family History  Problem Relation Age of Onset  . Arthritis Mother     Allergies  Allergen Reactions  . Sulfa Antibiotics Other (See Comments)    Childhood allergy    Current Outpatient Medications on File Prior to Visit  Medication Sig Dispense Refill  . acetaminophen (TYLENOL) 325 MG  tablet Take 2 tablets (650 mg total) by mouth every 4 (four) hours as needed for headache or mild pain. 30 tablet 0  . Calcium Carbonate-Vitamin D (CALCIUM-VITAMIN D3 PO) Take 1 tablet by mouth daily.    Marland Kitchen estradiol (ESTRACE) 0.1 MG/GM vaginal cream Insert vaginally 2-3 times weekly. 42.5 g 3  . ibandronate (BONIVA) 150 MG tablet Take one tablet by mouth once monthly in the morning with water. No food or other medications for 30 min. Do not lay flat for 1 hour. 3 tablet 3  . lactase (LACTAID) 3000 units tablet Take 3,000 Units by mouth daily as needed (for lactose intolerant).     . loratadine (CLARITIN) 10 MG tablet Take 10 mg  by mouth daily as needed for allergies.    . naproxen sodium (ALEVE) 220 MG tablet Take 220 mg by mouth daily as needed (for pain or headache).     No current facility-administered medications on file prior to visit.     There were no vitals taken for this visit.   Objective:   Physical Exam  Constitutional: She is oriented to person, place, and time. She appears well-nourished. She does not have a sickly appearance. She does not appear ill.  Respiratory: Effort normal. No respiratory distress.  Neurological: She is alert and oriented to person, place, and time.  Psychiatric: She has a normal mood and affect.           Assessment & Plan:

## 2019-01-21 NOTE — Patient Instructions (Signed)
Stop Boniva once you complete your final pill as discussed.  Continue to take 1200 mg of Calcium and at least 800 units of Vitmain D daily.   Be sure to get plenty of weight bearing exercise to improve bone strength.   It was a pleasure to see you today!

## 2019-01-21 NOTE — Addendum Note (Signed)
Addended by: Pleas Koch on: 01/21/2019 10:47 AM   Modules accepted: Orders

## 2019-01-25 NOTE — Discharge Summary (Signed)
[facility] [place] NAME: Jackie Oneal    MR#:  [id number]  DATE OF BIRTH:  16-Apr-1946  DATE OF ADMISSION:  01/14/2019 ADMITTING PHYSICIAN: Hillary Bow, MD  DATE OF DISCHARGE: 01/14/2019  6:51 PM  PRIMARY CARE PHYSICIAN: Pleas Koch, NP   ADMISSION DIAGNOSIS:  Dizziness [R42] Lower abdominal pain [R10.30] Chest pain, unspecified type [R07.9]  DISCHARGE DIAGNOSIS:  Active Problems:   Chest pain   SECONDARY DIAGNOSIS:   Past Medical History:  Diagnosis Date  . Arthritis   . Cataract 2019   resolved with surgery by Dr. George Ina  . Chicken pox   . Osteoporosis   . Vertigo      ADMITTING HISTORY  HISTORY OF PRESENT ILLNESS:  Jackie Oneal  is a 73 y.o. female with a known history of osteoporosis, cataracts, arthritis presents to the emergency room complaining of acute onset of chest and back and epigastric pain.  Patient had burning sensation.  On arrival to emergency room she had a CT of chest and abdomen which showed no dissection.  No clots.  Patient is feeling much improved.  She did not have any shortness of breath or palpitations or syncope.  Does have symptoms of GERD which respond to Tums.  Here her EKG and troponins have been normal.  Admitted to rule out acute coronary syndrome.  No arrhythmias.  HOSPITAL COURSE:   *Atypical chest pain.  Patient was admitted due to persistent pain in the ED.  This had resolved by the time I evaluated the patient.  She was admitted to telemetry floor under observation.  Troponin remain negative on repeat testing.  EKG showed nothing acute.  Symptoms were typical for GERD.  Started on PPI.  Patient had no arrhythmias on telemetry.  Discharged home in stable condition.  Advised to return if symptoms recur.  Follow-up with PCP.  CONSULTS OBTAINED:    DRUG ALLERGIES:   Allergies  Allergen Reactions  . Sulfa Antibiotics Other (See Comments)    Childhood allergy    DISCHARGE  MEDICATIONS:   Allergies as of 01/14/2019      Reactions   Sulfa Antibiotics Other (See Comments)   Childhood allergy      Medication List    TAKE these medications   acetaminophen 325 MG tablet Commonly known as: TYLENOL Take 2 tablets (650 mg total) by mouth every 4 (four) hours as needed for headache or mild pain.   CALCIUM-VITAMIN D3 PO Take 1 tablet by mouth daily.   estradiol 0.1 MG/GM vaginal cream Commonly known as: ESTRACE Insert vaginally 2-3 times weekly.   Lactaid 3000 units tablet Generic drug: lactase Take 3,000 Units by mouth daily as needed (for lactose intolerant).   naproxen sodium 220 MG tablet Commonly known as: ALEVE Take 220 mg by mouth daily as needed (for pain or headache).       Today   VITAL SIGNS:  Blood pressure 105/62, pulse 69, temperature 97.9 F (36.6 C), temperature source Oral, resp. rate 16, height 5\' 2"  (1.575 m), weight 68 kg, SpO2 100 %.  I/O:  No intake or output data in the 24 hours ending 01/25/19 1436  PHYSICAL EXAMINATION:  Physical Exam  GENERAL:  73 y.o.-year-old patient lying in the bed with no acute distress.  LUNGS: Normal breath sounds bilaterally, no wheezing, rales,rhonchi or crepitation. No use of accessory muscles of respiration.  CARDIOVASCULAR: S1, S2 normal. No murmurs, rubs, or gallops.  ABDOMEN: Soft, non-tender, non-distended. Bowel sounds  present. No organomegaly or mass.  NEUROLOGIC: Moves all 4 extremities. PSYCHIATRIC: The patient is alert and oriented x 3.  SKIN: No obvious rash, lesion, or ulcer.   DATA REVIEW:   CBC No results for input(s): WBC, HGB, HCT, PLT in the last 168 hours.  Chemistries  No results for input(s): NA, K, CL, CO2, GLUCOSE, BUN, CREATININE, CALCIUM, MG, AST, ALT, ALKPHOS, BILITOT in the last 168 hours.  Invalid input(s): GFRCGP  Cardiac Enzymes No results for input(s): TROPONINI in the last 168 hours.  Microbiology Results  Results for orders placed or performed  during the hospital encounter of 01/14/19  Novel Coronavirus,NAA,(SEND-OUT TO REF LAB - TAT 24-48 hrs); Hosp Order     Status: None   Collection Time: 01/14/19  9:17 AM   Specimen: Nasopharyngeal Swab; Respiratory  Result Value Ref Range Status   SARS-CoV-2, NAA NOT DETECTED NOT DETECTED Final    Comment: (NOTE) Testing was performed using the cobas(R) SARS-CoV-2 test. This test was developed and its performance characteristics determined by [employer]. This test has not been FDA cleared or approved. This test has been authorized by FDA under an Emergency Use Authorization (EUA). This test is only authorized for the duration of time the declaration that circumstances exist justifying the authorization of the emergency use of in vitro diagnostic tests for detection of SARS-CoV-2 virus and/or diagnosis of COVID-19 infection under section 564(b)(1) of the Act, 21 U.S.C. 500XFG-1(W)(2), unless the authorization is terminated or revoked sooner. When diagnostic testing is negative, the possibility of a false negative result should be considered in the context of a patient's recent exposures and the presence of clinical signs and symptoms consistent with COVID-19. An individual without symptoms of COVID-19 and who is not shedding SARS-CoV-2 virus would expect to have  a negative (not detected) result in this assay. Performed At: [facility] [place], [place] [id number] Rush Farmer MD EL:[id number]    [place]  Final    Comment: Performed at [facility], [address] [place], [place] [address]    RADIOLOGY:  No results found.  Follow up with PCP in 1 week.  Management plans discussed with the patient, family and they are in agreement.  CODE STATUS:  Code Status History    Date Active Date Inactive Code Status Order ID Comments User Context   01/14/2019 1004 01/14/2019 2156 Full Code [id number]  Hillary Bow,  MD ED   03/19/2018 1153 03/20/2018 1700 Full Code [id number]  SalaryAvel Peace, MD Inpatient   Advance Care Planning Activity      TOTAL TIME TAKING CARE OF THIS PATIENT ON DAY OF DISCHARGE: more than 30 minutes.   Jackie Oneal M.D on 01/25/2019 at 2:36 PM  Between 7am to 6pm - Pager - [phone]  After 6pm go to [email or web address] - password [email or web address] [facility]  Office  [phone]  CC: Primary care physician; Pleas Koch, NP  Note: This dictation was prepared with Dragon dictation along with smaller phrase technology. Any transcriptional errors that result from this process are unintentional.

## 2019-03-31 ENCOUNTER — Other Ambulatory Visit: Payer: Self-pay | Admitting: Primary Care

## 2019-03-31 DIAGNOSIS — N941 Unspecified dyspareunia: Secondary | ICD-10-CM

## 2019-03-31 DIAGNOSIS — N952 Postmenopausal atrophic vaginitis: Secondary | ICD-10-CM

## 2019-05-04 ENCOUNTER — Ambulatory Visit (INDEPENDENT_AMBULATORY_CARE_PROVIDER_SITE_OTHER): Payer: PPO

## 2019-05-04 ENCOUNTER — Other Ambulatory Visit: Payer: PPO

## 2019-05-04 DIAGNOSIS — Z23 Encounter for immunization: Secondary | ICD-10-CM

## 2019-05-06 ENCOUNTER — Ambulatory Visit: Payer: PPO

## 2019-05-10 ENCOUNTER — Other Ambulatory Visit: Payer: Self-pay | Admitting: Primary Care

## 2019-05-10 DIAGNOSIS — R739 Hyperglycemia, unspecified: Secondary | ICD-10-CM

## 2019-05-10 DIAGNOSIS — E559 Vitamin D deficiency, unspecified: Secondary | ICD-10-CM

## 2019-05-10 DIAGNOSIS — E785 Hyperlipidemia, unspecified: Secondary | ICD-10-CM

## 2019-05-11 ENCOUNTER — Encounter: Payer: PPO | Admitting: Primary Care

## 2019-05-17 ENCOUNTER — Other Ambulatory Visit (INDEPENDENT_AMBULATORY_CARE_PROVIDER_SITE_OTHER): Payer: PPO

## 2019-05-17 DIAGNOSIS — E559 Vitamin D deficiency, unspecified: Secondary | ICD-10-CM | POA: Diagnosis not present

## 2019-05-17 DIAGNOSIS — R739 Hyperglycemia, unspecified: Secondary | ICD-10-CM

## 2019-05-17 DIAGNOSIS — E785 Hyperlipidemia, unspecified: Secondary | ICD-10-CM | POA: Diagnosis not present

## 2019-05-17 LAB — COMPREHENSIVE METABOLIC PANEL
ALT: 13 U/L (ref 0–35)
AST: 17 U/L (ref 0–37)
Albumin: 4.6 g/dL (ref 3.5–5.2)
Alkaline Phosphatase: 40 U/L (ref 39–117)
BUN: 18 mg/dL (ref 6–23)
CO2: 28 mEq/L (ref 19–32)
Calcium: 9.2 mg/dL (ref 8.4–10.5)
Chloride: 100 mEq/L (ref 96–112)
Creatinine, Ser: 0.77 mg/dL (ref 0.40–1.20)
GFR: 73.86 mL/min (ref >60.00)
Glucose, Bld: 87 mg/dL (ref 70–99)
Potassium: 3.5 mEq/L (ref 3.5–5.1)
Sodium: 136 mEq/L (ref 135–145)
Total Bilirubin: 0.8 mg/dL (ref 0.2–1.2)
Total Protein: 7 g/dL (ref 6.0–8.3)

## 2019-05-17 LAB — LIPID PANEL
Cholesterol: 228 mg/dL — ABNORMAL HIGH (ref 0–200)
HDL: 57.3 mg/dL (ref >39.00)
LDL Cholesterol: 155 mg/dL — ABNORMAL HIGH (ref 0–99)
NonHDL: 170.23
Total CHOL/HDL Ratio: 4
Triglycerides: 76 mg/dL (ref 0.0–149.0)
VLDL: 15.2 mg/dL (ref 0.0–40.0)

## 2019-05-17 LAB — HEMOGLOBIN A1C: Hgb A1c MFr Bld: 5.4 % (ref 4.6–6.5)

## 2019-05-17 LAB — VITAMIN D 25 HYDROXY (VIT D DEFICIENCY, FRACTURES): VITD: 47.81 ng/mL (ref 30.00–100.00)

## 2019-05-24 ENCOUNTER — Other Ambulatory Visit: Payer: Self-pay

## 2019-05-24 ENCOUNTER — Ambulatory Visit (INDEPENDENT_AMBULATORY_CARE_PROVIDER_SITE_OTHER): Payer: PPO | Admitting: Primary Care

## 2019-05-24 VITALS — BP 104/74 | HR 84 | Temp 97.4°F | Ht 62.0 in | Wt 114.2 lb

## 2019-05-24 DIAGNOSIS — E559 Vitamin D deficiency, unspecified: Secondary | ICD-10-CM

## 2019-05-24 DIAGNOSIS — K219 Gastro-esophageal reflux disease without esophagitis: Secondary | ICD-10-CM | POA: Diagnosis not present

## 2019-05-24 DIAGNOSIS — M1990 Unspecified osteoarthritis, unspecified site: Secondary | ICD-10-CM | POA: Diagnosis not present

## 2019-05-24 DIAGNOSIS — E785 Hyperlipidemia, unspecified: Secondary | ICD-10-CM

## 2019-05-24 DIAGNOSIS — N952 Postmenopausal atrophic vaginitis: Secondary | ICD-10-CM | POA: Diagnosis not present

## 2019-05-24 DIAGNOSIS — Z0000 Encounter for general adult medical examination without abnormal findings: Secondary | ICD-10-CM

## 2019-05-24 DIAGNOSIS — M810 Age-related osteoporosis without current pathological fracture: Secondary | ICD-10-CM | POA: Diagnosis not present

## 2019-05-24 DIAGNOSIS — N941 Unspecified dyspareunia: Secondary | ICD-10-CM | POA: Diagnosis not present

## 2019-05-24 MED ORDER — ESTRADIOL 0.1 MG/GM VA CREA: TOPICAL_CREAM | VAGINAL | 3 refills | Status: DC

## 2019-05-24 NOTE — Assessment & Plan Note (Signed)
Could not tolerate bisphosphonate treatment, she is compliant to calcium and vitamin D and is active daily. Repeat bone density scan due in 2022.

## 2019-05-24 NOTE — Assessment & Plan Note (Signed)
Immunizations UTD. Mammogram and bone density UTD. Colonoscopy UTD, due in 2026. Commended her on a healthy diet and regular exercise.  Exam today unremarkable. Labs reviewed. Follow up in 1 year for CPE.

## 2019-05-24 NOTE — Patient Instructions (Addendum)
Continue exercising. You should be getting 150 minutes of moderate intensity exercise weekly.  Continue to eat a healthy diet. Ensure you are consuming 64 ounces of water daily.  Continue calcium and vitamin D.  It was a pleasure to see you today!   Preventive Care 54 Years and Older, Female Preventive care refers to lifestyle choices and visits with your health care provider that can promote health and wellness. This includes:  A yearly physical exam. This is also called an annual well check.  Regular dental and eye exams.  Immunizations.  Screening for certain conditions.  Healthy lifestyle choices, such as diet and exercise. What can I expect for my preventive care visit? Physical exam Your health care provider will check:  Height and weight. These may be used to calculate body mass index (BMI), which is a measurement that tells if you are at a healthy weight.  Heart rate and blood pressure.  Your skin for abnormal spots. Counseling Your health care provider may ask you questions about:  Alcohol, tobacco, and drug use.  Emotional well-being.  Home and relationship well-being.  Sexual activity.  Eating habits.  History of falls.  Memory and ability to understand (cognition).  Work and work Statistician.  Pregnancy and menstrual history. What immunizations do I need?  Influenza (flu) vaccine  This is recommended every year. Tetanus, diphtheria, and pertussis (Tdap) vaccine  You may need a Td booster every 10 years. Varicella (chickenpox) vaccine  You may need this vaccine if you have not already been vaccinated. Zoster (shingles) vaccine  You may need this after age 56. Pneumococcal conjugate (PCV13) vaccine  One dose is recommended after age 32. Pneumococcal polysaccharide (PPSV23) vaccine  One dose is recommended after age 41. Measles, mumps, and rubella (MMR) vaccine  You may need at least one dose of MMR if you were born in 1957 or later.  You may also need a second dose. Meningococcal conjugate (MenACWY) vaccine  You may need this if you have certain conditions. Hepatitis A vaccine  You may need this if you have certain conditions or if you travel or work in places where you may be exposed to hepatitis A. Hepatitis B vaccine  You may need this if you have certain conditions or if you travel or work in places where you may be exposed to hepatitis B. Haemophilus influenzae type b (Hib) vaccine  You may need this if you have certain conditions. You may receive vaccines as individual doses or as more than one vaccine together in one shot (combination vaccines). Talk with your health care provider about the risks and benefits of combination vaccines. What tests do I need? Blood tests  Lipid and cholesterol levels. These may be checked every 5 years, or more frequently depending on your overall health.  Hepatitis C test.  Hepatitis B test. Screening  Lung cancer screening. You may have this screening every year starting at age 42 if you have a 30-pack-year history of smoking and currently smoke or have quit within the past 15 years.  Colorectal cancer screening. All adults should have this screening starting at age 82 and continuing until age 74. Your health care provider may recommend screening at age 72 if you are at increased risk. You will have tests every 1-10 years, depending on your results and the type of screening test.  Diabetes screening. This is done by checking your blood sugar (glucose) after you have not eaten for a while (fasting). You may have this done every  1-3 years.  Mammogram. This may be done every 1-2 years. Talk with your health care provider about how often you should have regular mammograms.  BRCA-related cancer screening. This may be done if you have a family history of breast, ovarian, tubal, or peritoneal cancers. Other tests  Sexually transmitted disease (STD) testing.  Bone density scan.  This is done to screen for osteoporosis. You may have this done starting at age 57. Follow these instructions at home: Eating and drinking  Eat a diet that includes fresh fruits and vegetables, whole grains, lean protein, and low-fat dairy products. Limit your intake of foods with high amounts of sugar, saturated fats, and salt.  Take vitamin and mineral supplements as recommended by your health care provider.  Do not drink alcohol if your health care provider tells you not to drink.  If you drink alcohol: ? Limit how much you have to 0-1 drink a day. ? Be aware of how much alcohol is in your drink. In the [place] one drink equals one 12 oz bottle of beer (355 mL), one 5 oz glass of wine (148 mL), or one 1 oz glass of hard liquor (44 mL). Lifestyle  Take daily care of your teeth and gums.  Stay active. Exercise for at least 30 minutes on 5 or more days each week.  Do not use any products that contain nicotine or tobacco, such as cigarettes, e-cigarettes, and chewing tobacco. If you need help quitting, ask your health care provider.  If you are sexually active, practice safe sex. Use a condom or other form of protection in order to prevent STIs (sexually transmitted infections).  Talk with your health care provider about taking a low-dose aspirin or statin. What's next?  Go to your health care provider once a year for a well check visit.  Ask your health care provider how often you should have your eyes and teeth checked.  Stay up to date on all vaccines. This information is not intended to replace advice given to you by your health care provider. Make sure you discuss any questions you have with your health care provider. Document Released: 08/11/2015 Document Revised: 07/09/2018 Document Reviewed: 07/09/2018 Elsevier Patient Education  2020 [employer].

## 2019-05-24 NOTE — Assessment & Plan Note (Signed)
Recent vitamin D level stable, continue current regimen and to monitor.

## 2019-05-24 NOTE — Progress Notes (Signed)
Subjective:    Patient ID: Jackie Oneal, female    DOB: 11-10-1945, 73 y.o.   MRN: [id number]  HPI  Ms. Jackie Oneal is a 73 year old female who presents today for complete physical.  Immunizations: -Tetanus: Completed in 2015 -Influenza: Completed this season  -Pneumonia: Completed Prevnar and Pneumovax -Shingles: Completed in 2017  Diet: She endorse  Exercise: She is walking and also doing online exercising  Eye exam: Due in December 2020 Dental exam: Completes annually  Colonoscopy: Completed in 2016 Mammogram: Completed in 2020 Dexa: Completed in 2020 Hep C Screen: Negative   BP Readings from Last 3 Encounters:  05/24/19 104/74  01/14/19 105/62  09/29/18 106/72   The 10-year ASCVD risk score (Goff DC Jr., et al., 2013) is: 7.4%   Values used to calculate the score:     Age: 82 years     Sex: Female     Is Non-Hispanic African American: No     Diabetic: No     Tobacco smoker: No     Systolic Blood Pressure: 123456 mmHg     Is BP treated: No     HDL Cholesterol: 57.3 mg/dL     Total Cholesterol: 228 mg/dL    Review of Systems  Constitutional: Negative for unexpected weight change.  HENT: Negative for rhinorrhea.   Respiratory: Negative for cough and shortness of breath.   Cardiovascular: Negative for chest pain.  Gastrointestinal: Negative for constipation and diarrhea.  Genitourinary: Negative for difficulty urinating.  Musculoskeletal: Negative for arthralgias and myalgias.  Skin: Negative for rash.  Allergic/Immunologic: Negative for environmental allergies.  Neurological: Negative for dizziness, numbness and headaches.  Psychiatric/Behavioral: The patient is not nervous/anxious.        Past Medical History:  Diagnosis Date   Arthritis    Cataract 2019   resolved with surgery by Dr. George Ina   Chicken pox    Osteoporosis    Vertigo      Social History   Socioeconomic History   Marital status: Married    Spouse name: Not on file    Number of children: Not on file   Years of education: Not on file   Highest education level: Not on file  Occupational History   Not on file  Social Needs   Financial resource strain: Not on file   Food insecurity    Worry: Not on file    Inability: Not on file   Transportation needs    Medical: Not on file    Non-medical: Not on file  Tobacco Use   Smoking status: Never Smoker   Smokeless tobacco: Never Used  Substance and Sexual Activity   Alcohol use: Yes    Alcohol/week: 0.0 standard drinks    Comment: social   Drug use: No   Sexual activity: Yes  Lifestyle   Physical activity    Days per week: Not on file    Minutes per session: Not on file   Stress: Not on file  Relationships   Social connections    Talks on phone: Not on file    Gets together: Not on file    Attends religious service: Not on file    Active member of club or organization: Not on file    Attends meetings of clubs or organizations: Not on file    Relationship status: Not on file   Intimate partner violence    Fear of current or ex partner: Not on file    Emotionally abused: Not on file  Physically abused: Not on file    Forced sexual activity: Not on file  Other Topics Concern   Not on file  Social History Narrative   Married.   2 sons. 3 grandchildren.   Retired. Worked as a Water quality scientist.    Enjoys walking, decorating, reading, sewing.    Past Surgical History:  Procedure Laterality Date   CATARACT EXTRACTION W/PHACO Left 03/18/2017   Procedure: CATARACT EXTRACTION PHACO AND INTRAOCULAR LENS PLACEMENT (IOC);  Surgeon: Birder Robson, MD;  Location: [facility] ORS;  Service: Ophthalmology;  Laterality: Left;  [place] 01:23.9 AP% 20.9 CDE 17.58 Fluid Pack lot # [id number]   CATARACT EXTRACTION W/PHACO Right 11/25/2017   Procedure: CATARACT EXTRACTION PHACO AND INTRAOCULAR LENS PLACEMENT (IOC);  Surgeon: Birder Robson, MD;  Location: [facility] ORS;  Service: Ophthalmology;   Laterality: Right;  [place] 00:41.5 AP% 16.7 CDE 6.95 Fluid Pack Lot # [id number]   EYE SURGERY Left 03/2016   retina    Family History  Problem Relation Age of Onset   Arthritis Mother     Allergies  Allergen Reactions   Sulfa Antibiotics Other (See Comments)    Childhood allergy    Current Outpatient Medications on File Prior to Visit  Medication Sig Dispense Refill   acetaminophen (TYLENOL) 325 MG tablet Take 2 tablets (650 mg total) by mouth every 4 (four) hours as needed for headache or mild pain. 30 tablet 0   Calcium Carbonate-Vitamin D (CALCIUM-VITAMIN D3 PO) Take 1 tablet by mouth daily.     lactase (LACTAID) 3000 units tablet Take 3,000 Units by mouth daily as needed (for lactose intolerant).      loratadine (CLARITIN) 10 MG tablet Take 10 mg by mouth daily as needed for allergies.     naproxen sodium (ALEVE) 220 MG tablet Take 220 mg by mouth daily as needed (for pain or headache).     No current facility-administered medications on file prior to visit.     BP 104/74    Pulse 84    Temp (!) 97.4 F (36.3 C) (Temporal)    Ht 5\' 2"  (1.575 m)    Wt 114 lb 4 oz (51.8 kg)    SpO2 98%    BMI 20.90 kg/m    Objective:   Physical Exam  Constitutional: She is oriented to person, place, and time. She appears well-nourished.  HENT:  Right Ear: Tympanic membrane and ear canal normal.  Left Ear: Tympanic membrane and ear canal normal.  Mouth/Throat: Oropharynx is clear and moist.  Eyes: Pupils are equal, round, and reactive to light. EOM are normal.  Neck: Neck supple.  Cardiovascular: Normal rate and regular rhythm.  Respiratory: Effort normal and breath sounds normal.  GI: Soft. Bowel sounds are normal. There is no abdominal tenderness.  Musculoskeletal: Normal range of motion.  Neurological: She is alert and oriented to person, place, and time.  Skin: Skin is warm and dry.  Psychiatric: She has a normal mood and affect.           Assessment & Plan:

## 2019-05-24 NOTE — Assessment & Plan Note (Signed)
Chronic, overall stable. Intermittent left clavicle pain from prior fracture. Continue to monitor.

## 2019-05-24 NOTE — Assessment & Plan Note (Signed)
Doing very well with Estrace cream, continue same.

## 2019-05-24 NOTE — Assessment & Plan Note (Signed)
Denies GERD symptoms. Continue to monitor.

## 2019-05-24 NOTE — Assessment & Plan Note (Addendum)
Increase in LDL to 155, historically ranges from 120's-150's. Likely hereditary given her healthy weight and lifestyle.  She prefers not to start statin therapy, this seems reasonable. Continue to monitor.   Encouraged to continue to work on diet, continue with regular exercise.

## 2019-06-30 DIAGNOSIS — Z961 Presence of intraocular lens: Secondary | ICD-10-CM | POA: Diagnosis not present

## 2019-08-27 ENCOUNTER — Ambulatory Visit: Payer: PPO

## 2019-09-04 ENCOUNTER — Ambulatory Visit: Payer: PPO | Attending: Internal Medicine

## 2019-09-04 DIAGNOSIS — Z23 Encounter for immunization: Secondary | ICD-10-CM

## 2019-09-04 NOTE — Progress Notes (Signed)
   Covid-19 Vaccination Clinic  Name:  Jackie Oneal    MRN: [id number] DOB: 06/21/1946  09/04/2019  Jackie Oneal was observed post Covid-19 immunization for 15 minutes without incidence. She was provided with Vaccine Information Sheet and instruction to access the V-Safe system.   Jackie Oneal was instructed to call 911 with any severe reactions post vaccine: Marland Kitchen Difficulty breathing  . Swelling of your face and throat  . A fast heartbeat  . A bad rash all over your body  . Dizziness and weakness    Immunizations Administered    Name Date Dose VIS Date Route   Pfizer COVID-19 Vaccine 09/04/2019  5:04 PM 0.3 mL 07/09/2019 Intramuscular   Manufacturer: [place]   Lot: [id number]   [place]: [id number]

## 2019-09-13 ENCOUNTER — Ambulatory Visit: Payer: PPO

## 2019-09-29 ENCOUNTER — Ambulatory Visit: Payer: PPO | Attending: Internal Medicine

## 2019-09-29 DIAGNOSIS — Z23 Encounter for immunization: Secondary | ICD-10-CM | POA: Insufficient documentation

## 2019-09-29 NOTE — Progress Notes (Signed)
   Covid-19 Vaccination Clinic  Name:  Jackie Oneal    MRN: [id number] DOB: 12-16-1945  09/29/2019  [name] was observed post Covid-19 immunization for 15 minutes without incident. She was provided with Vaccine Information Sheet and instruction to access the V-Safe system.   [name] was instructed to call 911 with any severe reactions post vaccine: Marland Kitchen Difficulty breathing  . Swelling of face and throat  . A fast heartbeat  . A bad rash all over body  . Dizziness and weakness   Immunizations Administered    Name Date Dose VIS Date Route   Pfizer COVID-19 Vaccine 09/29/2019  1:23 PM 0.3 mL 07/09/2019 Intramuscular   Manufacturer: [place]   Lot: [id number]   [place]: [id number]

## 2019-12-28 ENCOUNTER — Ambulatory Visit (INDEPENDENT_AMBULATORY_CARE_PROVIDER_SITE_OTHER): Payer: PPO

## 2019-12-28 DIAGNOSIS — Z0000 Encounter for general adult medical examination without abnormal findings: Secondary | ICD-10-CM

## 2019-12-28 NOTE — Progress Notes (Signed)
Subjective:   Cameisha Wiecek is a 74 y.o. female who presents for Medicare Annual (Subsequent) preventive examination.  Review of Systems: N/A   I connected with the patient today by telephone and verified that I am speaking with the correct person using two identifiers. Location patient: home Location nurse: work Persons participating in the virtual visit: patient, Marine scientist.   I discussed the limitations, risks, security and privacy concerns of performing an evaluation and management service by telephone and the availability of in person appointments. I also discussed with the patient that there may be a patient responsible charge related to this service. The patient expressed understanding and verbally consented to this telephonic visit.    Interactive audio and video telecommunications were attempted between this nurse and patient, however failed, due to patient having technical difficulties OR patient did not have access to video capability.  We continued and completed visit with audio only.     Cardiac Risk Factors include: advanced age (>10men, >79 women);dyslipidemia     Objective:     Vitals: There were no vitals taken for this visit.  There is no height or weight on file to calculate BMI.  Advanced Directives 12/28/2019 01/14/2019 05/01/2018 03/19/2018 11/25/2017 04/08/2017  Does Patient Have a Medical Advance Directive? Yes No Yes Yes Yes Yes  Type of Paramedic of [place];Living will - [place];Living will Living will;Healthcare Power of Attorney Living will [place];Living will  Does patient want to make changes to medical advance directive? - - - No - Patient declined - -  Copy of [place] in Chart? No - copy requested - No - copy requested No - copy requested - Yes  Would patient like information on creating a medical advance directive? - No - Patient declined - - - -    Tobacco Social  History   Tobacco Use  Smoking Status Never Smoker  Smokeless Tobacco Never Used     Counseling given: Not Answered   Clinical Intake:  Pre-visit preparation completed: Yes  Pain : No/denies pain     Nutritional Risks: None Diabetes: No  How often do you need to have someone help you when you read instructions, pamphlets, or other written materials from your doctor or pharmacy?: 1 - Never What is the last grade level you completed in school?: bachelors  Interpreter Needed?: No  Information entered by :: CJohnson, LPN  Past Medical History:  Diagnosis Date  . Arthritis   . Cataract 2019   resolved with surgery by Dr. George Ina  . Chicken pox   . Osteoporosis   . Vertigo    Past Surgical History:  Procedure Laterality Date  . CATARACT EXTRACTION W/PHACO Left 03/18/2017   Procedure: CATARACT EXTRACTION PHACO AND INTRAOCULAR LENS PLACEMENT (IOC);  Surgeon: Birder Robson, MD;  Location: [facility] ORS;  Service: Ophthalmology;  Laterality: Left;  [place] 01:23.9 AP% 20.9 CDE 17.58 Fluid Pack lot # [id number]  . CATARACT EXTRACTION W/PHACO Right 11/25/2017   Procedure: CATARACT EXTRACTION PHACO AND INTRAOCULAR LENS PLACEMENT (IOC);  Surgeon: Birder Robson, MD;  Location: [facility] ORS;  Service: Ophthalmology;  Laterality: Right;  [place] 00:41.5 AP% 16.7 CDE 6.95 Fluid Pack Lot # [id number]  . EYE SURGERY Left 03/2016   retina   Family History  Problem Relation Age of Onset  . Arthritis Mother    Social History   Socioeconomic History  . Marital status: Married    Spouse name: Not on file  .  Number of children: Not on file  . Years of education: Not on file  . Highest education level: Not on file  Occupational History  . Not on file  Tobacco Use  . Smoking status: Never Smoker  . Smokeless tobacco: Never Used  Substance and Sexual Activity  . Alcohol use: Yes    Alcohol/week: 0.0 standard drinks    Comment: social  . Drug use: No  . Sexual activity: Yes  Other Topics  Concern  . Not on file  Social History Narrative   Married.   2 sons. 3 grandchildren.   Retired. Worked as a Water quality scientist.    Enjoys walking, decorating, reading, sewing.   Social Determinants of Health   Financial Resource Strain: Low Risk   . Difficulty of Paying Living Expenses: Not hard at all  Food Insecurity: No Food Insecurity  . Worried About Charity fundraiser in the Last Year: Never true  . Ran Out of Food in the Last Year: Never true  Transportation Needs: No Transportation Needs  . Lack of Transportation (Medical): No  . Lack of Transportation (Non-Medical): No  Physical Activity: Sufficiently Active  . Days of Exercise per Week: 7 days  . Minutes of Exercise per Session: 50 min  Stress: No Stress Concern Present  . Feeling of Stress : Not at all  Social Connections:   . Frequency of Communication with Friends and Family:   . Frequency of Social Gatherings with Friends and Family:   . Attends Religious Services:   . Active Member of Clubs or Organizations:   . Attends Archivist Meetings:   Marland Kitchen Marital Status:     Outpatient Encounter Medications as of 12/28/2019  Medication Sig  . acetaminophen (TYLENOL) 325 MG tablet Take 2 tablets (650 mg total) by mouth every 4 (four) hours as needed for headache or mild pain.  . Calcium Carbonate-Vitamin D (CALCIUM-VITAMIN D3 PO) Take 1 tablet by mouth daily.  Marland Kitchen estradiol (ESTRACE) 0.1 MG/GM vaginal cream apply 2-3 times weekly.  Marland Kitchen lactase (LACTAID) 3000 units tablet Take 3,000 Units by mouth daily as needed (for lactose intolerant).   . loratadine (CLARITIN) 10 MG tablet Take 10 mg by mouth daily as needed for allergies.  . naproxen sodium (ALEVE) 220 MG tablet Take 220 mg by mouth daily as needed (for pain or headache).   No facility-administered encounter medications on file as of 12/28/2019.    Activities of Daily Living In your present state of health, do you have any difficulty performing the following  activities: 12/28/2019 01/14/2019  Hearing? N N  Vision? N N  Difficulty concentrating or making decisions? N N  Walking or climbing stairs? N N  Dressing or bathing? N N  Doing errands, shopping? N N  Preparing Food and eating ? N -  Using the Toilet? N -  In the past six months, have you accidently leaked urine? N -  Do you have problems with loss of bowel control? N -  Managing your Medications? N -  Managing your Finances? N -  Housekeeping or managing your Housekeeping? N -  Some recent data might be hidden    Patient Care Team: Pleas Koch, NP as PCP - General (Internal Medicine)    Assessment:   This is a routine wellness examination for Jahmila.  Exercise Activities and Dietary recommendations Current Exercise Habits: Structured exercise class;Home exercise routine, Type of exercise: walking, Time (Minutes): 50, Frequency (Times/Week): 7, Weekly Exercise (Minutes/Week): 350, Intensity:  Moderate, Exercise limited by: None identified  Goals    . Increase physical activity     As tolerated, I will continue to walk at least 30 min 4-5 days per week.     . Patient Stated     12/28/2019, I will continue to walk 3 miles everyday and do silver sneakers 3-4 days a week for 45 minutes.        Fall Risk Fall Risk  12/28/2019 05/01/2018 04/08/2017 12/26/2015  Falls in the past year? 0 Yes No No  Comment - pt reports LOC and subsequent fall with clavicle fracture - -  Number falls in past yr: 0 1 - -  Injury with Fall? 0 Yes - -  Risk for fall due to : No Fall Risks - - -  Follow up Falls evaluation completed;Falls prevention discussed - - -   Is the patient's home free of loose throw rugs in walkways, pet beds, electrical cords, etc?   yes      Grab bars in the bathroom? no      Handrails on the stairs?   yes      Adequate lighting?   yes  Timed Get Up and Go performed: N/A  Depression Screen PHQ 2/9 Scores 12/28/2019 05/01/2018 04/08/2017 12/26/2015  PHQ - 2 Score 0 0 0 0    PHQ- 9 Score 0 0 0 -     Cognitive Function MMSE - Mini Mental State Exam 12/28/2019 05/01/2018 04/08/2017  Orientation to time 5 5 5   Orientation to Place 5 5 5   Registration 3 3 3   Attention/ Calculation 5 0 0  Recall 3 3 3   Language- name 2 objects - 0 0  Language- repeat 1 1 1   Language- follow 3 step command - 3 3  Language- read & follow direction - 0 0  Write a sentence - 0 0  Copy design - 0 0  Total score - 20 20  Mini Cog  Mini-Cog screen was completed. Maximum score is 22. A value of 0 denotes this part of the MMSE was not completed or the patient failed this part of the Mini-Cog screening.       Immunization History  Administered Date(s) Administered  . DTaP 09/14/2013  . Fluad Quad(high Dose 65+) 05/04/2019  . Influenza, High Dose Seasonal PF 04/18/2015  . Influenza,inj,Quad PF,6+ Mos 05/21/2016, 04/08/2017, 05/01/2018  . PFIZER SARS-COV-2 Vaccination 09/04/2019, 09/29/2019  . Pneumococcal Conjugate-13 12/26/2015  . Pneumococcal Polysaccharide-23 11/14/2013  . Tdap 09/14/2013  . Zoster 07/24/2016    Qualifies for Shingles Vaccine: Yes  Screening Tests Health Maintenance  Topic Date Due  . INFLUENZA VACCINE  02/27/2020  . MAMMOGRAM  07/30/2020  . TETANUS/TDAP  09/15/2023  . COLONOSCOPY  01/09/2025  . DEXA SCAN  Completed  . COVID-19 Vaccine  Completed  . Hepatitis C Screening  Completed  . PNA vac Low Risk Adult  Completed    Cancer Screenings: Lung: Low Dose CT Chest recommended if Age 14-80 years, 30 pack-year currently smoking OR have quit w/in 15 years. Patient does not qualify. Breast:  Up to date on Mammogram: Yes, completed 07/30/2018   Up to date of Bone Density/Dexa: Yes, completed 07/30/2018 Colorectal: Up to date, completed 01/10/2015  Additional Screenings:  Hepatitis C Screening: 12/22/2015     Plan:    Patient will continue to walk 3 miles everyday and do silver sneakers 3-4 days a week for 45 minutes.    I have personally reviewed  and noted  the following in the patient's chart:   . Medical and social history . Use of alcohol, tobacco or illicit drugs  . Current medications and supplements . Functional ability and status . Nutritional status . Physical activity . Advanced directives . List of other physicians . Hospitalizations, surgeries, and ER visits in previous 12 months . Vitals . Screenings to include cognitive, depression, and falls . Referrals and appointments  In addition, I have reviewed and discussed with patient certain preventive protocols, quality metrics, and best practice recommendations. A written personalized care plan for preventive services as well as general preventive health recommendations were provided to patient.     Andrez Grime, LPN  [id number]

## 2019-12-28 NOTE — Progress Notes (Signed)
PCP notes:  Health Maintenance: No gaps noted   Abnormal Screenings: none   Patient concerns: none   Nurse concerns: none   Next PCP appt.: 05/24/2020 @ 9:20 am

## 2019-12-28 NOTE — Patient Instructions (Addendum)
Jackie Oneal , Thank you for taking time to come for your Medicare Wellness Visit. I appreciate your ongoing commitment to your health goals. Please review the following plan we discussed and let me know if I can assist you in the future.   Screening recommendations/referrals: Colonoscopy: Up to date, completed 01/10/2015 Mammogram: Up to date, completed 07/30/2018 Bone Density: completed 07/30/2018 Recommended yearly ophthalmology/optometry visit for glaucoma screening and checkup Recommended yearly dental visit for hygiene and checkup  Vaccinations: Influenza vaccine: Up to date, completed 05/04/2019 Pneumococcal vaccine: Completed series Tdap vaccine: Up to date, completed 09/14/2013 Shingles vaccine: discussed    Advanced directives: Please bring a copy of your POA (Jackie Oneal) and/or Living Will to your next appointment.   Conditions/risks identified: hyperlipidemia  Next appointment: 05/24/2020 @ 9:20 am    Preventive Care 65 Years and Older, Female Preventive care refers to lifestyle choices and visits with your health care provider that can promote health and wellness. What does preventive care include?  A yearly physical exam. This is also called an annual well check.  Dental exams once or twice a year.  Routine eye exams. Ask your health care provider how often you should have your eyes checked.  Personal lifestyle choices, including:  Daily care of your teeth and gums.  Regular physical activity.  Eating a healthy diet.  Avoiding tobacco and drug use.  Limiting alcohol use.  Practicing safe sex.  Taking low-dose aspirin every day.  Taking vitamin and mineral supplements as recommended by your health care provider. What happens during an annual well check? The services and screenings done by your health care provider during your annual well check will depend on your age, overall health, lifestyle risk factors, and family history of disease. Counseling    Your health care provider may ask you questions about your:  Alcohol use.  Tobacco use.  Drug use.  Emotional well-being.  Home and relationship well-being.  Sexual activity.  Eating habits.  History of falls.  Memory and ability to understand (cognition).  Work and work Statistician.  Reproductive health. Screening  You may have the following tests or measurements:  Height, weight, and BMI.  Blood pressure.  Lipid and cholesterol levels. These may be checked every 5 years, or more frequently if you are over 48 years old.  Skin check.  Lung cancer screening. You may have this screening every year starting at age 25 if you have a 30-pack-year history of smoking and currently smoke or have quit within the past 15 years.  Fecal occult blood test (FOBT) of the stool. You may have this test every year starting at age 61.  Flexible sigmoidoscopy or colonoscopy. You may have a sigmoidoscopy every 5 years or a colonoscopy every 10 years starting at age 75.  Hepatitis C blood test.  Hepatitis B blood test.  Sexually transmitted disease (STD) testing.  Diabetes screening. This is done by checking your blood sugar (glucose) after you have not eaten for a while (fasting). You may have this done every 1-3 years.  Bone density scan. This is done to screen for osteoporosis. You may have this done starting at age 59.  Mammogram. This may be done every 1-2 years. Talk to your health care provider about how often you should have regular mammograms. Talk with your health care provider about your test results, treatment options, and if necessary, the need for more tests. Vaccines  Your health care provider may recommend certain vaccines, such as:  Influenza vaccine.  This is recommended every year.  Tetanus, diphtheria, and acellular pertussis (Tdap, Td) vaccine. You may need a Td booster every 10 years.  Zoster vaccine. You may need this after age 58.  Pneumococcal 13-valent  conjugate (PCV13) vaccine. One dose is recommended after age 4.  Pneumococcal polysaccharide (PPSV23) vaccine. One dose is recommended after age 62. Talk to your health care provider about which screenings and vaccines you need and how often you need them. This information is not intended to replace advice given to you by your health care provider. Make sure you discuss any questions you have with your health care provider. Document Released: 08/11/2015 Document Revised: 04/03/2016 Document Reviewed: 05/16/2015 Elsevier Interactive Patient Education  2017 [place] Prevention in the Home Falls can cause injuries. They can happen to people of all ages. There are many things you can do to make your home safe and to help prevent falls. What can I do on the outside of my home?  Regularly fix the edges of walkways and driveways and fix any cracks.  Remove anything that might make you trip as you walk through a door, such as a raised step or threshold.  Trim any bushes or trees on the path to your home.  Use bright outdoor lighting.  Clear any walking paths of anything that might make someone trip, such as rocks or tools.  Regularly check to see if handrails are loose or broken. Make sure that both sides of any steps have handrails.  Any raised decks and porches should have guardrails on the edges.  Have any leaves, snow, or ice cleared regularly.  Use sand or salt on walking paths during winter.  Clean up any spills in your garage right away. This includes oil or grease spills. What can I do in the bathroom?  Use night lights.  Install grab bars by the toilet and in the tub and shower. Do not use towel bars as grab bars.  Use non-skid mats or decals in the tub or shower.  If you need to sit down in the shower, use a plastic, non-slip stool.  Keep the floor dry. Clean up any water that spills on the floor as soon as it happens.  Remove soap buildup in the tub or  shower regularly.  Attach bath mats securely with double-sided non-slip rug tape.  Do not have throw rugs and other things on the floor that can make you trip. What can I do in the bedroom?  Use night lights.  Make sure that you have a light by your bed that is easy to reach.  Do not use any sheets or blankets that are too big for your bed. They should not hang down onto the floor.  Have a firm chair that has side arms. You can use this for support while you get dressed.  Do not have throw rugs and other things on the floor that can make you trip. What can I do in the kitchen?  Clean up any spills right away.  Avoid walking on wet floors.  Keep items that you use a lot in easy-to-reach places.  If you need to reach something above you, use a strong step stool that has a grab bar.  Keep electrical cords out of the way.  Do not use floor polish or wax that makes floors slippery. If you must use wax, use non-skid floor wax.  Do not have throw rugs and other things on the floor that can make  you trip. What can I do with my stairs?  Do not leave any items on the stairs.  Make sure that there are handrails on both sides of the stairs and use them. Fix handrails that are broken or loose. Make sure that handrails are as long as the stairways.  Check any carpeting to make sure that it is firmly attached to the stairs. Fix any carpet that is loose or worn.  Avoid having throw rugs at the top or bottom of the stairs. If you do have throw rugs, attach them to the floor with carpet tape.  Make sure that you have a light switch at the top of the stairs and the bottom of the stairs. If you do not have them, ask someone to add them for you. What else can I do to help prevent falls?  Wear shoes that:  Do not have high heels.  Have rubber bottoms.  Are comfortable and fit you well.  Are closed at the toe. Do not wear sandals.  If you use a stepladder:  Make sure that it is fully  opened. Do not climb a closed stepladder.  Make sure that both sides of the stepladder are locked into place.  Ask someone to hold it for you, if possible.  Clearly mark and make sure that you can see:  Any grab bars or handrails.  First and last steps.  Where the edge of each step is.  Use tools that help you move around (mobility aids) if they are needed. These include:  Canes.  Walkers.  Scooters.  Crutches.  Turn on the lights when you go into a dark area. Replace any light bulbs as soon as they burn out.  Set up your furniture so you have a clear path. Avoid moving your furniture around.  If any of your floors are uneven, fix them.  If there are any pets around you, be aware of where they are.  Review your medicines with your doctor. Some medicines can make you feel dizzy. This can increase your chance of falling. Ask your doctor what other things that you can do to help prevent falls. This information is not intended to replace advice given to you by your health care provider. Make sure you discuss any questions you have with your health care provider. Document Released: 05/11/2009 Document Revised: 12/21/2015 Document Reviewed: 08/19/2014 Elsevier Interactive Patient Education  2017 [employer].

## 2020-01-18 ENCOUNTER — Ambulatory Visit: Payer: PPO | Admitting: Dermatology

## 2020-01-18 ENCOUNTER — Other Ambulatory Visit: Payer: Self-pay

## 2020-01-18 DIAGNOSIS — Z1283 Encounter for screening for malignant neoplasm of skin: Secondary | ICD-10-CM

## 2020-01-18 DIAGNOSIS — R21 Rash and other nonspecific skin eruption: Secondary | ICD-10-CM

## 2020-01-18 DIAGNOSIS — L570 Actinic keratosis: Secondary | ICD-10-CM | POA: Diagnosis not present

## 2020-01-18 DIAGNOSIS — D1800 Hemangioma unspecified site: Secondary | ICD-10-CM

## 2020-01-18 DIAGNOSIS — L578 Other skin changes due to chronic exposure to nonionizing radiation: Secondary | ICD-10-CM

## 2020-01-18 DIAGNOSIS — L814 Other melanin hyperpigmentation: Secondary | ICD-10-CM | POA: Diagnosis not present

## 2020-01-18 DIAGNOSIS — L821 Other seborrheic keratosis: Secondary | ICD-10-CM | POA: Diagnosis not present

## 2020-01-18 DIAGNOSIS — D229 Melanocytic nevi, unspecified: Secondary | ICD-10-CM | POA: Diagnosis not present

## 2020-01-18 MED ORDER — VALACYCLOVIR HCL 1 G PO TABS: 1000.0000 mg | ORAL_TABLET | Freq: Three times a day (TID) | ORAL | 0 refills | Status: AC

## 2020-01-18 NOTE — Progress Notes (Signed)
Follow-Up Visit   Subjective  Jackie Oneal is a 74 y.o. female who presents for the following: TBSE.  Patient presents today for annual TBSE, with some areas of concern on forehead that are rough, dry and scaly, on B/L cheeks that are red and scaly, and a rash on Right arm and flank since Thursday that is itchy and sore.  She has had the shingles vaccine.  The scaly spot on her r forehead was biopsied in the past and was not a cancer according to the patient.  It has been frozen several times, and it will improve for a while, but them grows back.  The following portions of the chart were reviewed this encounter and updated as appropriate:      Review of Systems:  No other skin or systemic complaints except as noted in HPI or Assessment and Plan.  Objective  Well appearing patient in no apparent distress; mood and affect are within normal limits.  A full examination was performed including scalp, head, eyes, ears, nose, lips, neck, chest, axillae, abdomen, back, buttocks, bilateral upper extremities, bilateral lower extremities, hands, feet, fingers, toes, fingernails, and toenails. All findings within normal limits unless otherwise noted below.  Objective  Left Cheek, Left Upper Vermilion Lip, Right Lower Cheek, Right Upper Forehead, Right Upper Vermilion Lip: Erythematous thin macules with gritty scale.   Objective  Right Upper Arm - Anterior: Pink edematous papules in linear pattern extending down R arm, pt reports soreness, nerve-like pain   Assessment & Plan  AK (actinic keratosis) (5) Left Upper Vermilion Lip; Right Upper Vermilion Lip; Left Cheek; Right Lower Cheek; Right Upper Forehead  Recurrent AK on R forehead, consider re-biopsy if doesn't clear Recommend PDT therapy for face 2 treatments 1 months apart. Needs to stay out of sun  after treatment for 48 hours.  Pt will schedule in Fall  Cryotherapy today    Destruction of lesion - Left Cheek, Left Upper  Vermilion Lip, Right Lower Cheek, Right Upper Forehead, Right Upper Vermilion Lip  Destruction method: cryotherapy   Destruction method comment:  Electrodessication Informed consent: discussed and consent obtained   Lesion destroyed using liquid nitrogen: Yes   Region frozen until ice ball extended beyond lesion: Yes   Outcome: patient tolerated procedure well with no complications   Post-procedure details: wound care instructions given   Additional details:  Prior to procedure, discussed risks of blister formation, small wound, skin dyspigmentation, or rare scar following cryotherapy.  Rash Right Upper Arm - Anterior  Possible Shingles Start Valtrex 1p.o TID for 1 week  valACYclovir (VALTREX) 1000 MG tablet - Right Upper Arm - Anterior  Lentigines - Scattered tan macules - Discussed due to sun exposure - Benign, observe - Call for any changes  Seborrheic Keratoses - Stuck-on, waxy, tan-brown papules and plaques  - Discussed benign etiology and prognosis. - Observe - Call for any changes  Melanocytic Nevi - Tan-brown and/or pink-flesh-colored symmetric macules and papules - Benign appearing on exam today - Observation - Call clinic for new or changing moles - Recommend daily use of broad spectrum spf 30+ sunscreen to sun-exposed areas.   Hemangiomas - Red papules - Discussed benign nature - Observe - Call for any changes  Actinic Damage - diffuse scaly erythematous macules with underlying dyspigmentation - Recommend daily broad spectrum sunscreen SPF 30+ to sun-exposed areas, reapply every 2 hours as needed.  - Call for new or changing lesions.  Skin cancer screening performed today.   Return in  about 6 months (around 07/19/2020) for AK and PDT Follow up.  Marene Lenz, CMA, am acting as scribe for Brendolyn Patty, MD .  Documentation: I have reviewed the above documentation for accuracy and completeness, and I agree with the above.  Brendolyn Patty MD

## 2020-01-18 NOTE — Patient Instructions (Addendum)
Recommend daily broad spectrum sunscreen SPF 30+ to sun-exposed areas, reapply every 2 hours as needed. Call for new or changing lesions.  Prior to procedure, discussed risks of blister formation, small wound, skin dyspigmentation, or rare scar following cryotherapy.   Cryotherapy Aftercare  . Wash gently with soap and water everyday.   Marland Kitchen Apply Vaseline and Band-Aid daily until healed.   Shingles  Shingles is an infection. It gives you a painful skin rash and blisters that have fluid in them. Shingles is caused by the same germ (virus) that causes chickenpox. Shingles only happens in people who:  Have had chickenpox.  Have been given a shot of medicine (vaccine) to protect against chickenpox. Shingles is rare in this group. The first symptoms of shingles may be itching, tingling, or pain in an area on your skin. A rash will show on your skin a few days or weeks later. The rash is likely to be on one side of your body. The rash usually has a shape like a belt or a band. Over time, the rash turns into fluid-filled blisters. The blisters will break open, change into scabs, and dry up. Medicines may:  Help with pain and itching.  Help you get better sooner.  Help to prevent long-term problems. Follow these instructions at home: Medicines  Take over-the-counter and prescription medicines only as told by your doctor.  Put on an anti-itch cream or numbing cream where you have a rash, blisters, or scabs. Do this as told by your doctor. Helping with itching and discomfort   Put cold, wet cloths (cold compresses) on the area of the rash or blisters as told by your doctor.  Cool baths can help you feel better. Try adding baking soda or dry oatmeal to the water to lessen itching. Do not bathe in hot water. Blister and rash care  Keep your rash covered with a loose bandage (dressing).  Wear loose clothing that does not rub on your rash.  Keep your rash and blisters clean. To do this,  wash the area with mild soap and cool water as told by your doctor.  Check your rash every day for signs of infection. Check for: ? More redness, swelling, or pain. ? Fluid or blood. ? Warmth. ? Pus or a bad smell.  Do not scratch your rash. Do not pick at your blisters. To help you to not scratch: ? Keep your fingernails clean and cut short. ? Wear gloves or mittens when you sleep, if scratching is a problem. General instructions  Rest as told by your doctor.  Keep all follow-up visits as told by your doctor. This is important.  Wash your hands often with soap and water. If soap and water are not available, use hand sanitizer. Doing this lowers your chance of getting a skin infection caused by germs (bacteria).  Your infection can cause chickenpox in people who have never had chickenpox or never got a shot of chickenpox vaccine. If you have blisters that did not change into scabs yet, try not to touch other people or be around other people, especially: ? Babies. ? Pregnant women. ? Children who have areas of red, itchy, or rough skin (eczema). ? Very old people who have transplants. ? People who have a long-term (chronic) sickness, like cancer or AIDS. Contact a doctor if:  Your pain does not get better with medicine.  Your pain does not get better after the rash heals.  You have any signs of infection in the  rash area. These signs include: ? More redness, swelling, or pain around the rash. ? Fluid or blood coming from the rash. ? The rash area feeling warm to the touch. ? Pus or a bad smell coming from the rash. Get help right away if:  The rash is on your face or nose.  You have pain in your face or pain by your eye.  You lose feeling on one side of your face.  You have trouble seeing.  You have ear pain, or you have ringing in your ear.  You have a loss of taste.  Your condition gets worse. Summary  Shingles gives you a painful skin rash and blisters that have  fluid in them.  Shingles is an infection. It is caused by the same germ (virus) that causes chickenpox.  Keep your rash covered with a loose bandage (dressing). Wear loose clothing that does not rub on your rash.  If you have blisters that did not change into scabs yet, try not to touch other people or be around people. This information is not intended to replace advice given to you by your health care provider. Make sure you discuss any questions you have with your health care provider. Document Revised: 11/06/2018 Document Reviewed: 03/19/2017 Elsevier Patient Education  2020 [employer].

## 2020-05-03 ENCOUNTER — Ambulatory Visit (INDEPENDENT_AMBULATORY_CARE_PROVIDER_SITE_OTHER): Payer: PPO

## 2020-05-03 ENCOUNTER — Other Ambulatory Visit: Payer: Self-pay

## 2020-05-03 DIAGNOSIS — Z23 Encounter for immunization: Secondary | ICD-10-CM

## 2020-05-08 ENCOUNTER — Other Ambulatory Visit: Payer: Self-pay | Admitting: Primary Care

## 2020-05-08 DIAGNOSIS — E559 Vitamin D deficiency, unspecified: Secondary | ICD-10-CM

## 2020-05-08 DIAGNOSIS — E785 Hyperlipidemia, unspecified: Secondary | ICD-10-CM

## 2020-05-17 ENCOUNTER — Other Ambulatory Visit: Payer: Self-pay

## 2020-05-17 ENCOUNTER — Other Ambulatory Visit (INDEPENDENT_AMBULATORY_CARE_PROVIDER_SITE_OTHER): Payer: PPO

## 2020-05-17 DIAGNOSIS — E559 Vitamin D deficiency, unspecified: Secondary | ICD-10-CM

## 2020-05-17 DIAGNOSIS — E785 Hyperlipidemia, unspecified: Secondary | ICD-10-CM

## 2020-05-17 LAB — LIPID PANEL
Cholesterol: 205 mg/dL — ABNORMAL HIGH (ref 0–200)
HDL: 63.3 mg/dL (ref >39.00)
LDL Cholesterol: 130 mg/dL — ABNORMAL HIGH (ref 0–99)
NonHDL: 141.74
Total CHOL/HDL Ratio: 3
Triglycerides: 58 mg/dL (ref 0.0–149.0)
VLDL: 11.6 mg/dL (ref 0.0–40.0)

## 2020-05-17 LAB — COMPREHENSIVE METABOLIC PANEL
ALT: 12 U/L (ref 0–35)
AST: 16 U/L (ref 0–37)
Albumin: 4.6 g/dL (ref 3.5–5.2)
Alkaline Phosphatase: 49 U/L (ref 39–117)
BUN: 18 mg/dL (ref 6–23)
CO2: 31 mEq/L (ref 19–32)
Calcium: 9.3 mg/dL (ref 8.4–10.5)
Chloride: 100 mEq/L (ref 96–112)
Creatinine, Ser: 0.78 mg/dL (ref 0.40–1.20)
GFR: 75.84 mL/min (ref >60.00)
Glucose, Bld: 78 mg/dL (ref 70–99)
Potassium: 4 mEq/L (ref 3.5–5.1)
Sodium: 136 mEq/L (ref 135–145)
Total Bilirubin: 0.9 mg/dL (ref 0.2–1.2)
Total Protein: 6.6 g/dL (ref 6.0–8.3)

## 2020-05-17 LAB — CBC
HCT: 37.9 % (ref 36.0–46.0)
Hemoglobin: 12.9 g/dL (ref 12.0–15.0)
MCHC: 33.9 g/dL (ref 30.0–36.0)
MCV: 92.6 fl (ref 78.0–100.0)
Platelets: 240 10*3/uL (ref 150.0–400.0)
RBC: 4.1 Mil/uL (ref 3.87–5.11)
RDW: 13.2 % (ref 11.5–15.5)
WBC: 5.7 10*3/uL (ref 4.0–10.5)

## 2020-05-17 LAB — VITAMIN D 25 HYDROXY (VIT D DEFICIENCY, FRACTURES): VITD: 39.54 ng/mL (ref 30.00–100.00)

## 2020-05-24 ENCOUNTER — Encounter: Payer: PPO | Admitting: Primary Care

## 2020-05-24 ENCOUNTER — Encounter: Payer: Self-pay | Admitting: Primary Care

## 2020-05-24 ENCOUNTER — Ambulatory Visit (INDEPENDENT_AMBULATORY_CARE_PROVIDER_SITE_OTHER): Payer: PPO | Admitting: Primary Care

## 2020-05-24 ENCOUNTER — Other Ambulatory Visit: Payer: Self-pay

## 2020-05-24 VITALS — BP 102/58 | HR 74 | Temp 97.5°F | Ht 62.0 in | Wt 113.0 lb

## 2020-05-24 DIAGNOSIS — K219 Gastro-esophageal reflux disease without esophagitis: Secondary | ICD-10-CM

## 2020-05-24 DIAGNOSIS — Z1231 Encounter for screening mammogram for malignant neoplasm of breast: Secondary | ICD-10-CM

## 2020-05-24 DIAGNOSIS — Z0000 Encounter for general adult medical examination without abnormal findings: Secondary | ICD-10-CM

## 2020-05-24 DIAGNOSIS — M1990 Unspecified osteoarthritis, unspecified site: Secondary | ICD-10-CM | POA: Diagnosis not present

## 2020-05-24 DIAGNOSIS — N941 Unspecified dyspareunia: Secondary | ICD-10-CM

## 2020-05-24 DIAGNOSIS — M810 Age-related osteoporosis without current pathological fracture: Secondary | ICD-10-CM

## 2020-05-24 DIAGNOSIS — N952 Postmenopausal atrophic vaginitis: Secondary | ICD-10-CM

## 2020-05-24 DIAGNOSIS — E559 Vitamin D deficiency, unspecified: Secondary | ICD-10-CM

## 2020-05-24 DIAGNOSIS — E2839 Other primary ovarian failure: Secondary | ICD-10-CM | POA: Diagnosis not present

## 2020-05-24 DIAGNOSIS — H8110 Benign paroxysmal vertigo, unspecified ear: Secondary | ICD-10-CM

## 2020-05-24 DIAGNOSIS — Z23 Encounter for immunization: Secondary | ICD-10-CM | POA: Diagnosis not present

## 2020-05-24 DIAGNOSIS — E785 Hyperlipidemia, unspecified: Secondary | ICD-10-CM | POA: Diagnosis not present

## 2020-05-24 MED ORDER — ESTRADIOL 0.1 MG/GM VA CREA: TOPICAL_CREAM | VAGINAL | 3 refills | Status: DC

## 2020-05-24 MED ORDER — ZOSTER VAC RECOMB ADJUVANTED 50 MCG/0.5ML IM SUSR: 0.5000 mL | Freq: Once | INTRAMUSCULAR | 1 refills | Status: AC

## 2020-05-24 NOTE — Assessment & Plan Note (Signed)
Intermittent, no change. Continue to monitor.

## 2020-05-24 NOTE — Assessment & Plan Note (Signed)
Doing well on Estrace, continue same.

## 2020-05-24 NOTE — Patient Instructions (Signed)
Add in 1000 IU of vitamin D daily.  Continue calcium with vitamin D daily.  Continue exercising. You should be getting 150 minutes of moderate intensity exercise weekly.  Continue to eat a healthy diet. Ensure you are consuming 64 ounces of water daily.  Schedule your mammogram and bone density scan for January 2022.  It was a pleasure to see you today!   Preventive Care 24 Years and Older, Female Preventive care refers to lifestyle choices and visits with your health care provider that can promote health and wellness. This includes:  A yearly physical exam. This is also called an annual well check.  Regular dental and eye exams.  Immunizations.  Screening for certain conditions.  Healthy lifestyle choices, such as diet and exercise. What can I expect for my preventive care visit? Physical exam Your health care provider will check:  Height and weight. These may be used to calculate body mass index (BMI), which is a measurement that tells if you are at a healthy weight.  Heart rate and blood pressure.  Your skin for abnormal spots. Counseling Your health care provider may ask you questions about:  Alcohol, tobacco, and drug use.  Emotional well-being.  Home and relationship well-being.  Sexual activity.  Eating habits.  History of falls.  Memory and ability to understand (cognition).  Work and work Statistician.  Pregnancy and menstrual history. What immunizations do I need?  Influenza (flu) vaccine  This is recommended every year. Tetanus, diphtheria, and pertussis (Tdap) vaccine  You may need a Td booster every 10 years. Varicella (chickenpox) vaccine  You may need this vaccine if you have not already been vaccinated. Zoster (shingles) vaccine  You may need this after age 65. Pneumococcal conjugate (PCV13) vaccine  One dose is recommended after age 23. Pneumococcal polysaccharide (PPSV23) vaccine  One dose is recommended after age 82. Measles,  mumps, and rubella (MMR) vaccine  You may need at least one dose of MMR if you were born in 1957 or later. You may also need a second dose. Meningococcal conjugate (MenACWY) vaccine  You may need this if you have certain conditions. Hepatitis A vaccine  You may need this if you have certain conditions or if you travel or work in places where you may be exposed to hepatitis A. Hepatitis B vaccine  You may need this if you have certain conditions or if you travel or work in places where you may be exposed to hepatitis B. Haemophilus influenzae type b (Hib) vaccine  You may need this if you have certain conditions. You may receive vaccines as individual doses or as more than one vaccine together in one shot (combination vaccines). Talk with your health care provider about the risks and benefits of combination vaccines. What tests do I need? Blood tests  Lipid and cholesterol levels. These may be checked every 5 years, or more frequently depending on your overall health.  Hepatitis C test.  Hepatitis B test. Screening  Lung cancer screening. You may have this screening every year starting at age 6 if you have a 30-pack-year history of smoking and currently smoke or have quit within the past 15 years.  Colorectal cancer screening. All adults should have this screening starting at age 76 and continuing until age 5. Your health care provider may recommend screening at age 22 if you are at increased risk. You will have tests every 1-10 years, depending on your results and the type of screening test.  Diabetes screening. This is done  by checking your blood sugar (glucose) after you have not eaten for a while (fasting). You may have this done every 1-3 years.  Mammogram. This may be done every 1-2 years. Talk with your health care provider about how often you should have regular mammograms.  BRCA-related cancer screening. This may be done if you have a family history of breast, ovarian,  tubal, or peritoneal cancers. Other tests  Sexually transmitted disease (STD) testing.  Bone density scan. This is done to screen for osteoporosis. You may have this done starting at age 63. Follow these instructions at home: Eating and drinking  Eat a diet that includes fresh fruits and vegetables, whole grains, lean protein, and low-fat dairy products. Limit your intake of foods with high amounts of sugar, saturated fats, and salt.  Take vitamin and mineral supplements as recommended by your health care provider.  Do not drink alcohol if your health care provider tells you not to drink.  If you drink alcohol: ? Limit how much you have to 0-1 drink a day. ? Be aware of how much alcohol is in your drink. In the [place] one drink equals one 12 oz bottle of beer (355 mL), one 5 oz glass of wine (148 mL), or one 1 oz glass of hard liquor (44 mL). Lifestyle  Take daily care of your teeth and gums.  Stay active. Exercise for at least 30 minutes on 5 or more days each week.  Do not use any products that contain nicotine or tobacco, such as cigarettes, e-cigarettes, and chewing tobacco. If you need help quitting, ask your health care provider.  If you are sexually active, practice safe sex. Use a condom or other form of protection in order to prevent STIs (sexually transmitted infections).  Talk with your health care provider about taking a low-dose aspirin or statin. What's next?  Go to your health care provider once a year for a well check visit.  Ask your health care provider how often you should have your eyes and teeth checked.  Stay up to date on all vaccines. This information is not intended to replace advice given to you by your health care provider. Make sure you discuss any questions you have with your health care provider. Document Revised: 07/09/2018 Document Reviewed: 07/09/2018 Elsevier Patient Education  2020 [employer].

## 2020-05-24 NOTE — Assessment & Plan Note (Signed)
Due for repeat bone density scan for January 2022.  She cannot tolerate bisphosphonate treatment, declines prolia. Compliant to calcium and vitamin D and regular weight bearing exercise.  Continue to monitor.

## 2020-05-24 NOTE — Assessment & Plan Note (Addendum)
No symptoms since she has been off bisphosphonate treatment.  Continue to monitor.

## 2020-05-24 NOTE — Progress Notes (Signed)
Subjective:    Patient ID: Jackie Oneal, female    DOB: November 06, 1945, 74 y.o.   MRN: [id number]  HPI  Ms. Jackie Oneal is a 74 year old female who presents today for complete physical.  Immunizations: -Tetanus: Completed in 2015 -Influenza: Completed  -Shingles: Zostavax completed in 2017 -Pneumonia: Completed Prevnar 2017, Pneumovax 2015 -Covid-19: Completed in 2021  Diet: She endorses a healthy diet Exercise: She is active daily   Eye exam: Completes annually  Dental exam: Completes semi-annually   Mammogram: Completed in 2020 Dexa: Completed in 2020, compliant to calcium and vitamin D.  Colonoscopy: Completed in 2016, due in 2026 Hep C Screen: Negative  BP Readings from Last 3 Encounters:  05/24/20 (!) 102/58  05/24/19 104/74  01/14/19 105/62      Review of Systems  Constitutional: Negative for unexpected weight change.  HENT: Negative for rhinorrhea.   Respiratory: Negative for cough and shortness of breath.   Cardiovascular: Negative for chest pain.  Gastrointestinal: Negative for constipation and diarrhea.  Genitourinary: Negative for difficulty urinating.  Musculoskeletal: Negative for arthralgias and myalgias.  Skin: Negative for rash.  Allergic/Immunologic: Positive for environmental allergies.  Neurological: Negative for numbness and headaches.  Psychiatric/Behavioral: The patient is not nervous/anxious.        Past Medical History:  Diagnosis Date  . Arthritis   . Cataract 2019   resolved with surgery by Dr. George Ina  . Chicken pox   . Osteoporosis   . Vertigo      Social History   Socioeconomic History  . Marital status: Married    Spouse name: Not on file  . Number of children: Not on file  . Years of education: Not on file  . Highest education level: Not on file  Occupational History  . Not on file  Tobacco Use  . Smoking status: Never Smoker  . Smokeless tobacco: Never Used  Vaping Use  . Vaping Use: Never used  Substance and  Sexual Activity  . Alcohol use: Yes    Alcohol/week: 0.0 standard drinks    Comment: social  . Drug use: No  . Sexual activity: Yes  Other Topics Concern  . Not on file  Social History Narrative   Married.   2 sons. 3 grandchildren.   Retired. Worked as a Water quality scientist.    Enjoys walking, decorating, reading, sewing.   Social Determinants of Health   Financial Resource Strain: Low Risk   . Difficulty of Paying Living Expenses: Not hard at all  Food Insecurity: No Food Insecurity  . Worried About Charity fundraiser in the Last Year: Never true  . Ran Out of Food in the Last Year: Never true  Transportation Needs: No Transportation Needs  . Lack of Transportation (Medical): No  . Lack of Transportation (Non-Medical): No  Physical Activity: Sufficiently Active  . Days of Exercise per Week: 7 days  . Minutes of Exercise per Session: 50 min  Stress: No Stress Concern Present  . Feeling of Stress : Not at all  Social Connections:   . Frequency of Communication with Friends and Family: Not on file  . Frequency of Social Gatherings with Friends and Family: Not on file  . Attends Religious Services: Not on file  . Active Member of Clubs or Organizations: Not on file  . Attends Archivist Meetings: Not on file  . Marital Status: Not on file  Intimate Partner Violence: Not At Risk  . Fear of Current or Ex-Partner: No  .  Emotionally Abused: No  . Physically Abused: No  . Sexually Abused: No    Past Surgical History:  Procedure Laterality Date  . CATARACT EXTRACTION W/PHACO Left 03/18/2017   Procedure: CATARACT EXTRACTION PHACO AND INTRAOCULAR LENS PLACEMENT (IOC);  Surgeon: Birder Robson, MD;  Location: [facility] ORS;  Service: Ophthalmology;  Laterality: Left;  [place] 01:23.9 AP% 20.9 CDE 17.58 Fluid Pack lot # [id number]  . CATARACT EXTRACTION W/PHACO Right 11/25/2017   Procedure: CATARACT EXTRACTION PHACO AND INTRAOCULAR LENS PLACEMENT (IOC);  Surgeon: Birder Robson, MD;  Location: [facility] ORS;  Service: Ophthalmology;  Laterality: Right;  [place] 00:41.5 AP% 16.7 CDE 6.95 Fluid Pack Lot # [id number]  . EYE SURGERY Left 03/2016   retina    Family History  Problem Relation Age of Onset  . Arthritis Mother     Allergies  Allergen Reactions  . Sulfa Antibiotics Other (See Comments)    Childhood allergy    Current Outpatient Medications on File Prior to Visit  Medication Sig Dispense Refill  . acetaminophen (TYLENOL) 325 MG tablet Take 2 tablets (650 mg total) by mouth every 4 (four) hours as needed for headache or mild pain. 30 tablet 0  . Calcium Carbonate-Vitamin D (CALCIUM-VITAMIN D3 PO) Take 1 tablet by mouth daily.    Marland Kitchen estradiol (ESTRACE) 0.1 MG/GM vaginal cream apply 2-3 times weekly. 42.5 g 3  . lactase (LACTAID) 3000 units tablet Take 3,000 Units by mouth daily as needed (for lactose intolerant).     . loratadine (CLARITIN) 10 MG tablet Take 10 mg by mouth daily as needed for allergies.    . naproxen sodium (ALEVE) 220 MG tablet Take 220 mg by mouth daily as needed (for pain or headache).    . OVER THE COUNTER MEDICATION Eye drops as needed.     No current facility-administered medications on file prior to visit.    BP (!) 102/58   Pulse 74   Temp (!) 97.5 F (36.4 C) (Temporal)   Ht 5\' 2"  (1.575 m)   Wt 113 lb (51.3 kg)   SpO2 98%   BMI 20.67 kg/m    Objective:   Physical Exam HENT:     Right Ear: Tympanic membrane and ear canal normal.     Left Ear: Tympanic membrane and ear canal normal.  Eyes:     Pupils: Pupils are equal, round, and reactive to light.  Cardiovascular:     Rate and Rhythm: Normal rate and regular rhythm.  Pulmonary:     Effort: Pulmonary effort is normal.     Breath sounds: Normal breath sounds.  Abdominal:     General: Bowel sounds are normal.     Palpations: Abdomen is soft.     Tenderness: There is no abdominal tenderness.  Musculoskeletal:        General: Normal range of motion.      Cervical back: Neck supple.  Skin:    General: Skin is warm and dry.  Neurological:     Mental Status: She is alert and oriented to person, place, and time.     Cranial Nerves: No cranial nerve deficit.     Deep Tendon Reflexes:     Reflex Scores:      Patellar reflexes are 2+ on the right side and 2+ on the left side. Psychiatric:        Mood and Affect: Mood normal.            Assessment & Plan:

## 2020-05-24 NOTE — Assessment & Plan Note (Signed)
Compliant to calcium and vitamin D. Recent vitamin D level of 39. Given her osteoporosis, would like to see an increase.   Add 1000 IU daily.

## 2020-05-24 NOTE — Assessment & Plan Note (Signed)
Immunizations UTD. Rx for Shingrix provided. Mammogram and bone density scan due in January 2022. Orders placed. Colonoscopy UTD, due in 2026.  Commended her on a healthy diet and regular exercise.  Exam today unremarkable. Labs reviewed.

## 2020-05-24 NOTE — Assessment & Plan Note (Signed)
Improved from last year. Continue to monitor.

## 2020-05-24 NOTE — Assessment & Plan Note (Signed)
Stable, no increase. Commended her on regular activity.

## 2020-06-24 IMAGING — CT CT ANGIO CHEST
2 of 6 series · 18 of 46 positions shown · IV contrast (APPLIED)
Comparison: None.

CLINICAL DATA: Chest pain and dizziness

EXAM:
CT ANGIOGRAPHY CHEST WITH CONTRAST
TECHNIQUE: Multidetector CT imaging of the chest was performed using the
standard protocol during bolus administration of intravenous
contrast. Multiplanar CT image reconstructions and MIPs were
obtained to evaluate the vascular anatomy.
CONTRAST:  75mL [id number] IOPAMIDOL ([id number]) INJECTION 76%

[Series 5: thins · axial · 0.53mm/px · z∈[-246,-24]mm · 15 of 244 slices shown]
[im 11/244  lung]
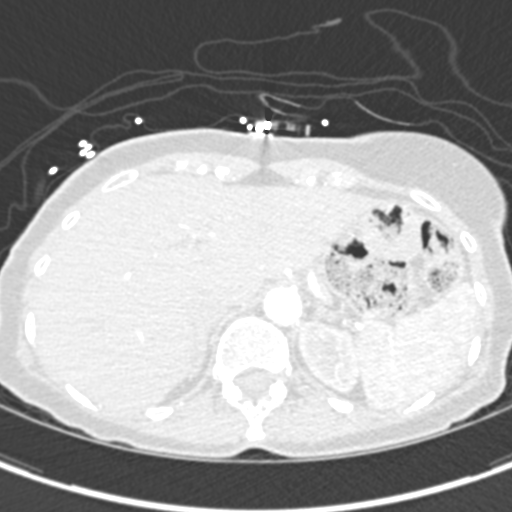
[im 32/244  soft-tissue]
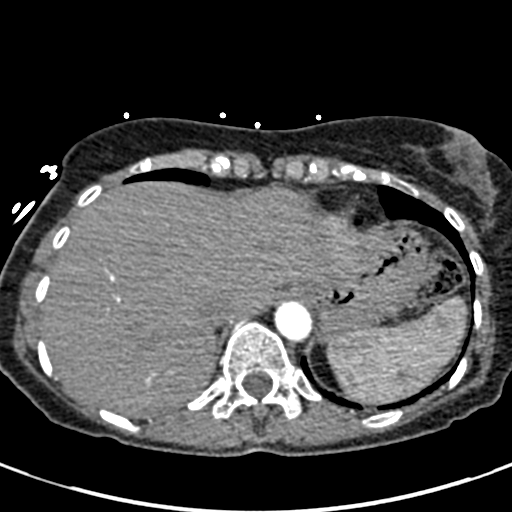
[im 43/244  lung]
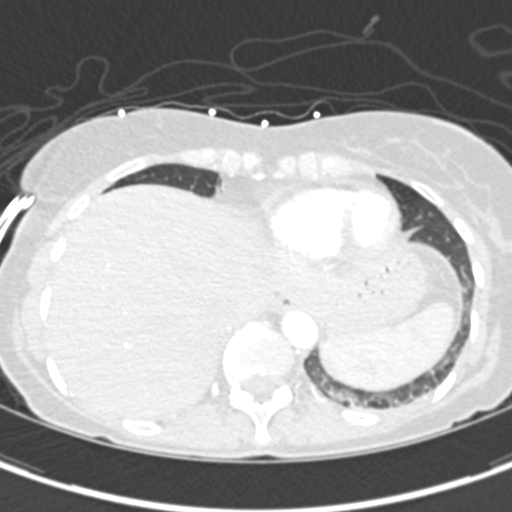
[im 64/244  soft-tissue]
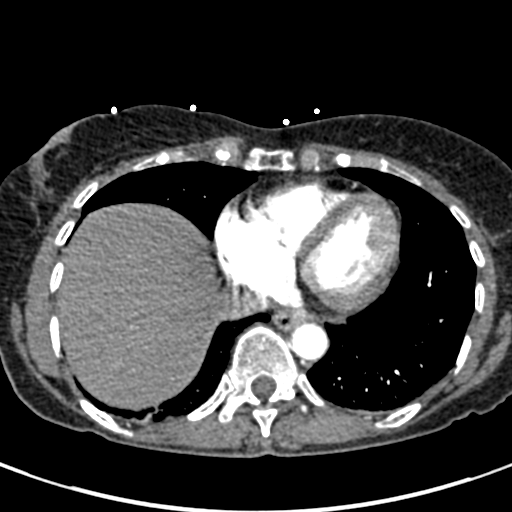
[im 74/244  lung]
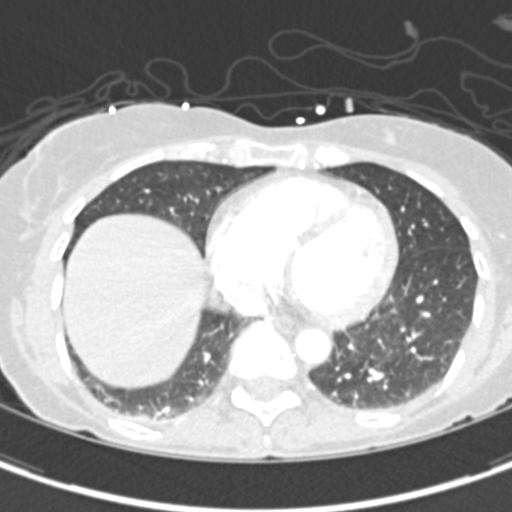
[im 96/244  soft-tissue]
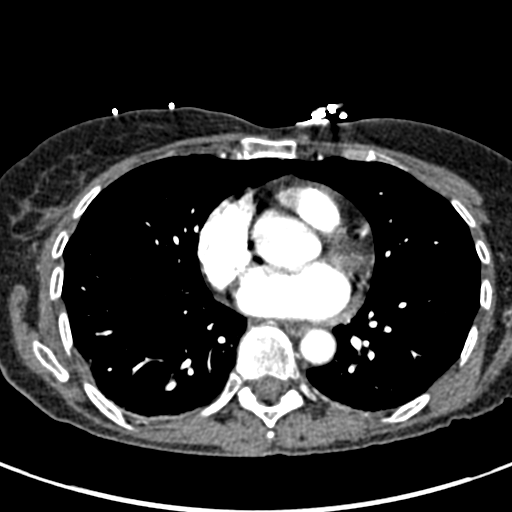
[im 106/244  lung]
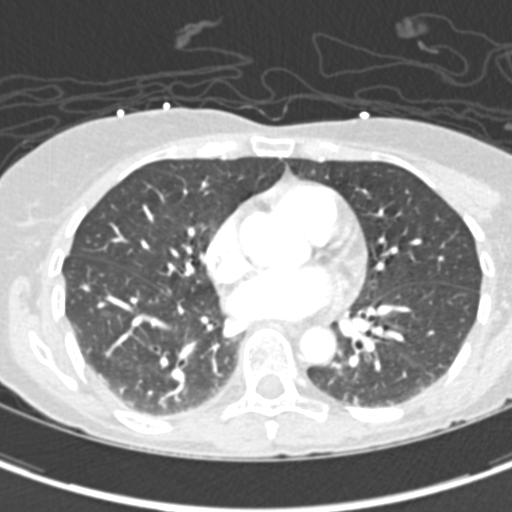
[im 127/244  soft-tissue]
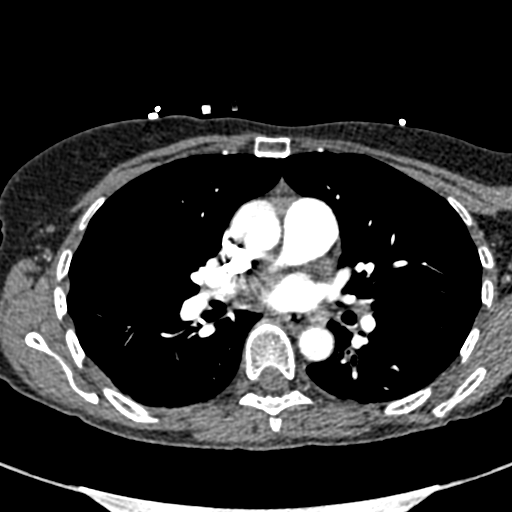
[im 138/244  lung]
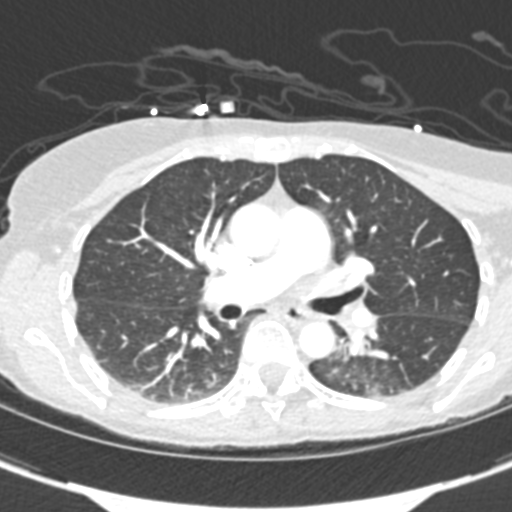
[im 148/244  soft-tissue]
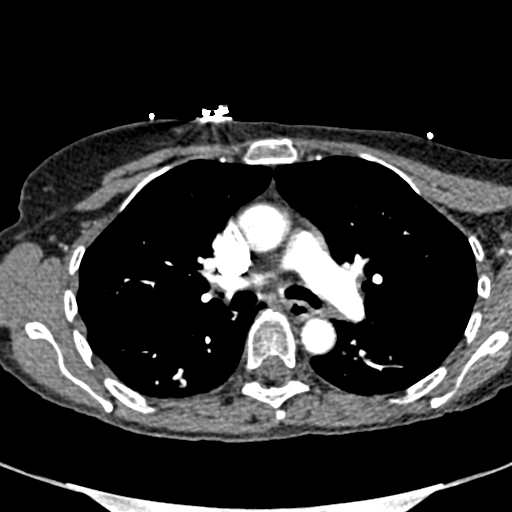
[im 170/244  lung]
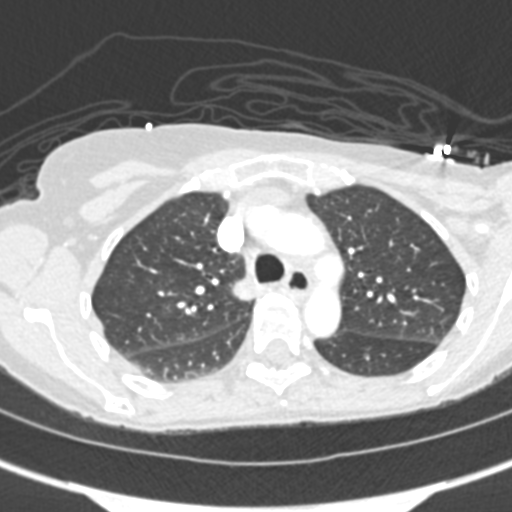
[im 180/244  soft-tissue]
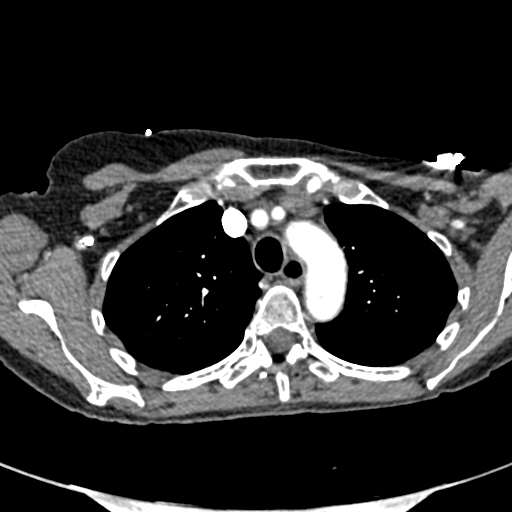
[im 201/244  lung]
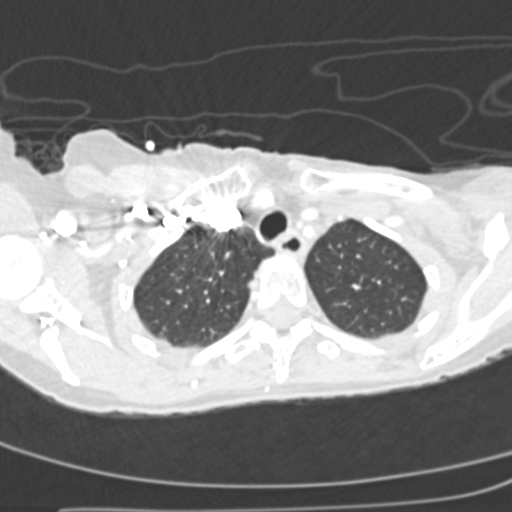
[im 212/244  soft-tissue]
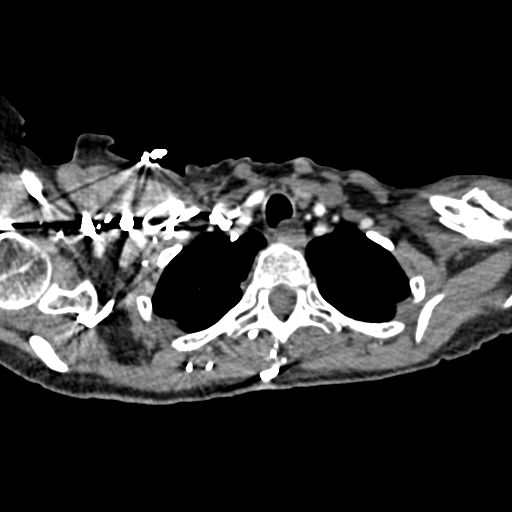
[im 233/244  lung]
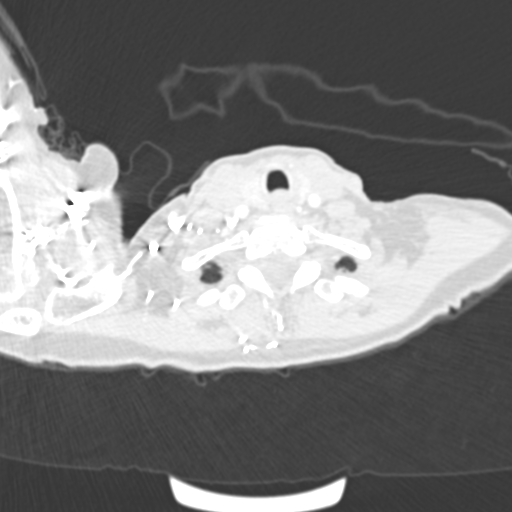

[Series 7: coronal mpr · coronal · 0.50mm/px · 3 of 61 slices shown]
[im 16/61  soft-tissue]
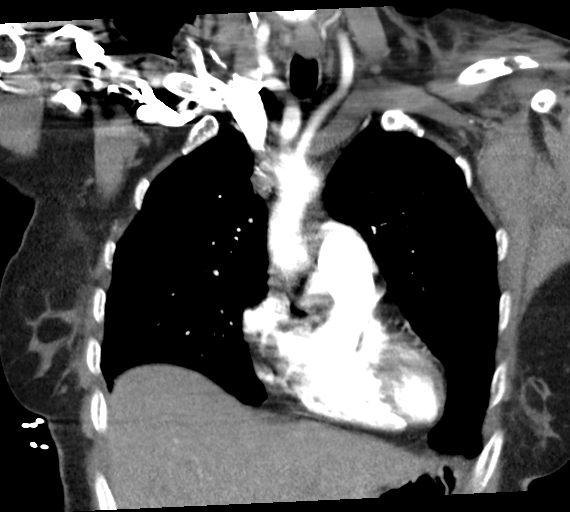
[im 31/61  soft-tissue]
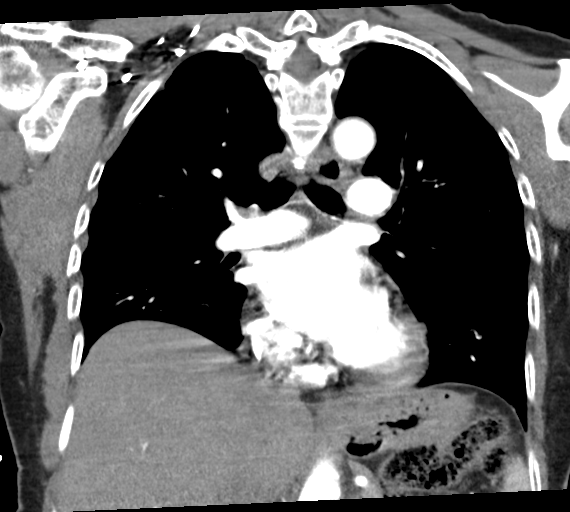
[im 46/61  soft-tissue]
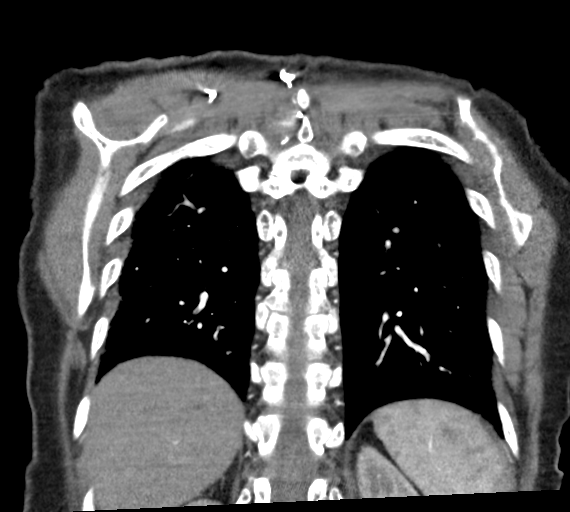

[18 of 46 positions shown; findings below may reference images not displayed]

FINDINGS: Cardiovascular: There is no demonstrable pulmonary embolus. There is
no thoracic aortic aneurysm or dissection. Visualized great vessels
appear normal. There is atherosclerosis in the thoracic aorta. There
is no appreciable pericardial effusion or pericardial thickening.

Mediastinum/Nodes: Thyroid appears normal. There are subcentimeter
mediastinal lymph nodes. There is no adenopathy in the thoracic
region by size criteria. No esophageal lesions are appreciable.

Lungs/Pleura: There is lower lobe atelectatic change bilaterally,
slightly more on the left than on the right. There is no frank
consolidation. There is slight lower lobe bronchiectatic change. No
pleural effusion or pleural thickening evident.

Upper Abdomen: Visualized upper abdominal structures appear
unremarkable.

Musculoskeletal: There are no blastic or lytic bone lesions. No
chest wall lesions are evident.

Review of the MIP images confirms the above findings.
IMPRESSION: 1. No demonstrable pulmonary embolus. No thoracic aortic aneurysm or
dissection. There is aortic atherosclerosis.

2. Areas of atelectatic change in the lower lung zones, slightly
more on the left than on the right. No consolidation. No pleural
effusion.

3.  No thoracic adenopathy by size criteria.

Aortic Atherosclerosis ([id number]).

## 2020-07-10 ENCOUNTER — Other Ambulatory Visit: Payer: Self-pay

## 2020-07-10 ENCOUNTER — Ambulatory Visit (INDEPENDENT_AMBULATORY_CARE_PROVIDER_SITE_OTHER): Payer: PPO | Admitting: Dermatology

## 2020-07-10 DIAGNOSIS — L578 Other skin changes due to chronic exposure to nonionizing radiation: Secondary | ICD-10-CM

## 2020-07-10 DIAGNOSIS — L821 Other seborrheic keratosis: Secondary | ICD-10-CM

## 2020-07-10 DIAGNOSIS — L570 Actinic keratosis: Secondary | ICD-10-CM | POA: Diagnosis not present

## 2020-07-10 NOTE — Patient Instructions (Signed)
Cryotherapy Aftercare  . Wash gently with soap and water everyday.   . Apply Vaseline and Band-Aid daily until healed.  

## 2020-07-10 NOTE — Progress Notes (Signed)
   Follow-Up Visit   Subjective  Jackie Oneal is a 74 y.o. female who presents for the following: Follow-up (Hx Aks of the face. She does have a spot on her face and chest to be checked today.).   The following portions of the chart were reviewed this encounter and updated as appropriate:       Review of Systems:  No other skin or systemic complaints except as noted in HPI or Assessment and Plan.  Objective  Well appearing patient in no apparent distress; mood and affect are within normal limits.  A focused examination was performed including face, chest. Relevant physical exam findings are noted in the Assessment and Plan.  Objective  L lateral brow: Erythematous thin papules/macules with gritty scale.  R forehead and upper lip clear   Assessment & Plan   Actinic Damage - chronic, secondary to cumulative UV radiation exposure/sun exposure over time - diffuse scaly erythematous macules with underlying dyspigmentation - Recommend daily broad spectrum sunscreen SPF 30+ to sun-exposed areas, reapply every 2 hours as needed.  - Call for new or changing lesions. -Discussed PDT treatment. Patient prefers to wait until next year.  AK (actinic keratosis) L lateral brow  Destruction of lesion - L lateral brow  Destruction method: cryotherapy   Informed consent: discussed and consent obtained   Lesion destroyed using liquid nitrogen: Yes   Region frozen until ice ball extended beyond lesion: Yes   Outcome: patient tolerated procedure well with no complications   Post-procedure details: wound care instructions given     Seborrheic Keratoses - Stuck-on, waxy, tan-brown papules and plaques of the left clavicle - Discussed benign etiology and prognosis. - Observe - Call for any changes   Return in about 6 months (around 01/08/2021) for TBSE.  IJamesetta Orleans, CMA, am acting as scribe for Brendolyn Patty, MD .  Documentation: I have reviewed the above documentation for  accuracy and completeness, and I agree with the above.  Brendolyn Patty MD

## 2020-07-13 DIAGNOSIS — H43811 Vitreous degeneration, right eye: Secondary | ICD-10-CM | POA: Diagnosis not present

## 2020-09-05 ENCOUNTER — Other Ambulatory Visit: Payer: Self-pay

## 2020-09-05 ENCOUNTER — Ambulatory Visit
Admission: RE | Admit: 2020-09-05 | Discharge: 2020-09-05 | Disposition: A | Discharge status: Home / Self Care | Payer: PPO | Source: Ambulatory Visit | Attending: Primary Care | Admitting: Primary Care

## 2020-09-05 DIAGNOSIS — E2839 Other primary ovarian failure: Secondary | ICD-10-CM | POA: Insufficient documentation

## 2020-09-05 DIAGNOSIS — Z1231 Encounter for screening mammogram for malignant neoplasm of breast: Secondary | ICD-10-CM | POA: Diagnosis not present

## 2020-09-05 DIAGNOSIS — M810 Age-related osteoporosis without current pathological fracture: Secondary | ICD-10-CM | POA: Diagnosis not present

## 2020-09-08 ENCOUNTER — Other Ambulatory Visit: Payer: Self-pay | Admitting: Primary Care

## 2020-09-08 DIAGNOSIS — R928 Other abnormal and inconclusive findings on diagnostic imaging of breast: Secondary | ICD-10-CM

## 2020-09-08 DIAGNOSIS — N6489 Other specified disorders of breast: Secondary | ICD-10-CM

## 2020-09-20 ENCOUNTER — Ambulatory Visit
Admission: RE | Admit: 2020-09-20 | Discharge: 2020-09-20 | Disposition: A | Discharge status: Home / Self Care | Payer: PPO | Source: Ambulatory Visit | Attending: Primary Care | Admitting: Primary Care

## 2020-09-20 ENCOUNTER — Other Ambulatory Visit: Payer: Self-pay | Admitting: Primary Care

## 2020-09-20 ENCOUNTER — Other Ambulatory Visit: Payer: Self-pay

## 2020-09-20 DIAGNOSIS — N6489 Other specified disorders of breast: Secondary | ICD-10-CM | POA: Diagnosis not present

## 2020-09-20 DIAGNOSIS — R928 Other abnormal and inconclusive findings on diagnostic imaging of breast: Secondary | ICD-10-CM | POA: Insufficient documentation

## 2020-09-20 DIAGNOSIS — N6320 Unspecified lump in the left breast, unspecified quadrant: Secondary | ICD-10-CM

## 2020-09-29 ENCOUNTER — Ambulatory Visit
Admission: RE | Admit: 2020-09-29 | Discharge: 2020-09-29 | Disposition: A | Discharge status: Home / Self Care | Payer: PPO | Source: Ambulatory Visit | Attending: Primary Care | Admitting: Primary Care

## 2020-09-29 ENCOUNTER — Other Ambulatory Visit: Payer: Self-pay

## 2020-09-29 DIAGNOSIS — R928 Other abnormal and inconclusive findings on diagnostic imaging of breast: Secondary | ICD-10-CM | POA: Insufficient documentation

## 2020-09-29 DIAGNOSIS — N6320 Unspecified lump in the left breast, unspecified quadrant: Secondary | ICD-10-CM | POA: Insufficient documentation

## 2020-09-29 DIAGNOSIS — N6322 Unspecified lump in the left breast, upper inner quadrant: Secondary | ICD-10-CM | POA: Diagnosis not present

## 2020-09-29 HISTORY — PX: BREAST BIOPSY: SHX20

## 2020-10-02 LAB — SURGICAL PATHOLOGY

## 2021-01-09 ENCOUNTER — Other Ambulatory Visit: Payer: Self-pay

## 2021-01-09 ENCOUNTER — Ambulatory Visit (INDEPENDENT_AMBULATORY_CARE_PROVIDER_SITE_OTHER): Payer: PPO | Admitting: Dermatology

## 2021-01-09 DIAGNOSIS — D2371 Other benign neoplasm of skin of right lower limb, including hip: Secondary | ICD-10-CM

## 2021-01-09 DIAGNOSIS — L570 Actinic keratosis: Secondary | ICD-10-CM

## 2021-01-09 DIAGNOSIS — L820 Inflamed seborrheic keratosis: Secondary | ICD-10-CM | POA: Diagnosis not present

## 2021-01-09 DIAGNOSIS — L821 Other seborrheic keratosis: Secondary | ICD-10-CM | POA: Diagnosis not present

## 2021-01-09 DIAGNOSIS — D692 Other nonthrombocytopenic purpura: Secondary | ICD-10-CM

## 2021-01-09 DIAGNOSIS — D1800 Hemangioma unspecified site: Secondary | ICD-10-CM | POA: Diagnosis not present

## 2021-01-09 DIAGNOSIS — D229 Melanocytic nevi, unspecified: Secondary | ICD-10-CM | POA: Diagnosis not present

## 2021-01-09 DIAGNOSIS — Z1283 Encounter for screening for malignant neoplasm of skin: Secondary | ICD-10-CM

## 2021-01-09 DIAGNOSIS — L578 Other skin changes due to chronic exposure to nonionizing radiation: Secondary | ICD-10-CM | POA: Diagnosis not present

## 2021-01-09 DIAGNOSIS — L814 Other melanin hyperpigmentation: Secondary | ICD-10-CM | POA: Diagnosis not present

## 2021-01-09 MED ORDER — FLUOROURACIL 5 % EX CREA: TOPICAL_CREAM | CUTANEOUS | 0 refills | Status: DC

## 2021-01-09 MED ORDER — CALCIPOTRIENE 0.005 % EX CREA: TOPICAL_CREAM | CUTANEOUS | 0 refills | Status: DC

## 2021-01-09 NOTE — Progress Notes (Signed)
Follow-Up Visit   Subjective  Jackie Oneal is a 75 y.o. female who presents for the following: 6 month follow up (Patient here for 6 month tbse. She reports a few spots at face she would like checked. ). She has an itchy spot on her finger that won't go away.  Patient here for full body skin exam and skin cancer screening.  The following portions of the chart were reviewed this encounter and updated as appropriate:       Objective  Well appearing patient in no apparent distress; mood and affect are within normal limits.  A full examination was performed including scalp, head, eyes, ears, nose, lips, neck, chest, axillae, abdomen, back, buttocks, bilateral upper extremities, bilateral lower extremities, hands, feet, fingers, toes, fingernails, and toenails. All findings within normal limits unless otherwise noted below.  upper forehead x 9, right zygoma x 1, right lower lip vermillion x 1 (11) Erythematous thin papules/macules with gritty scale.   Left dorsal finger x 1 Erythematous waxy stuck-on macule  Assessment & Plan  AK (actinic keratosis) (11) upper forehead x 9, right zygoma x 1, right lower lip vermillion x 1  Prior to procedure, discussed risks of blister formation, small wound, skin dyspigmentation, or rare scar following cryotherapy. Recommend Vaseline ointment to treated areas while healing.   Start fluorouracil 5% cream apply to affected areas of forehead twice daily for 7 days. Start calcipotriene 0.005 % cream apply to forehead twice daily for 7 days.   Actinic keratoses are precancerous spots that appear secondary to cumulative UV radiation exposure/sun exposure over time. They are chronic with expected duration over 1 year. A portion of actinic keratoses will progress to squamous cell carcinoma of the skin. It is not possible to reliably predict which spots will progress to skin cancer and so treatment is recommended to prevent development of skin  cancer.  Recommend daily broad spectrum sunscreen SPF 30+ to sun-exposed areas, reapply every 2 hours as needed.  Recommend staying in the shade or wearing long sleeves, sun glasses (UVA+UVB protection) and wide brim hats (4-inch brim around the entire circumference of the hat). Call for new or changing lesions.     Destruction of lesion - upper forehead x 9, right zygoma x 1, right lower lip vermillion x 1  Destruction method: cryotherapy   Informed consent: discussed and consent obtained   Lesion destroyed using liquid nitrogen: Yes   Region frozen until ice ball extended beyond lesion: Yes   Outcome: patient tolerated procedure well with no complications   Post-procedure details: wound care instructions given    fluorouracil (EFUDEX) 5 % cream - upper forehead x 9, right zygoma x 1, right lower lip vermillion x 1 Apply to forehead twice daily for 7 days  calcipotriene (DOVONOX) 0.005 % cream - upper forehead x 9, right zygoma x 1, right lower lip vermillion x 1 Apply to forehead twice daily for 7 days  Inflamed seborrheic keratosis Left dorsal finger x 1  Prior to procedure, discussed risks of blister formation, small wound, skin dyspigmentation, or rare scar following cryotherapy. Recommend Vaseline ointment to treated areas while healing.   Destruction of lesion - Left dorsal finger x 1  Destruction method: cryotherapy   Informed consent: discussed and consent obtained   Lesion destroyed using liquid nitrogen: Yes   Region frozen until ice ball extended beyond lesion: Yes   Outcome: patient tolerated procedure well with no complications   Post-procedure details: wound care instructions given  Lentigines - Scattered tan macules - Due to sun exposure - Benign-appering, observe - Recommend daily broad spectrum sunscreen SPF 30+ to sun-exposed areas, reapply every 2 hours as needed. - Call for any changes  Purpura - Chronic; persistent and recurrent.  Treatable, but not  curable. - Violaceous macules and patches at  - Benign - Related to trauma, age, sun damage and/or use of blood thinners, chronic use of topical and/or oral steroids - Observe - Can use OTC arnica containing moisturizer such as Dermend Bruise Formula if desired - Call for worsening or other concerns  Dermatofibroma - Firm pink/brown papulenodule with dimple sign at right lower pretibia  - Benign appearing - Call for any changes  Seborrheic Keratoses - Stuck-on, waxy, tan-brown papules and/or plaques  - Benign-appearing - Discussed benign etiology and prognosis. - Observe - Call for any changes  Melanocytic Nevi - Tan-brown and/or pink-flesh-colored symmetric macules and papules at right pretibia  - Benign appearing on exam today - Observation - Call clinic for new or changing moles - Recommend daily use of broad spectrum spf 30+ sunscreen to sun-exposed areas.   Hemangiomas - Red papules - Discussed benign nature - Observe - Call for any changes  Actinic Damage - Severe, confluent actinic changes with pre-cancerous actinic keratoses  - Severe, chronic, not at goal, secondary to cumulative UV radiation exposure over time - diffuse scaly erythematous macules and papules with underlying dyspigmentation - Discussed Prescription "Field Treatment" for Severe, Chronic Confluent Actinic Changes with Pre-Cancerous Actinic Keratoses Field treatment involves treatment of an entire area of skin that has confluent Actinic Changes (Sun/ Ultraviolet light damage) and PreCancerous Actinic Keratoses by method of PhotoDynamic Therapy (PDT) and/or prescription Topical Chemotherapy agents such as 5-fluorouracil, 5-fluorouracil/calcipotriene, and/or imiquimod.  The purpose is to decrease the number of clinically evident and subclinical PreCancerous lesions to prevent progression to development of skin cancer by chemically destroying early precancer changes that may or may not be visible.  It has been  shown to reduce the risk of developing skin cancer in the treated area. As a result of treatment, redness, scaling, crusting, and open sores may occur during treatment course. One or more than one of these methods may be used and may have to be used several times to control, suppress and eliminate the PreCancerous changes. Discussed treatment course, expected reaction, and possible side effects. - Recommend daily broad spectrum sunscreen SPF 30+ to sun-exposed areas, reapply every 2 hours as needed.  - Staying in the shade or wearing long sleeves, sun glasses (UVA+UVB protection) and wide brim hats (4-inch brim around the entire circumference of the hat) are also recommended. - Call for new or changing lesions. - pt not interested in doing PDT Start 5-fluorouracil/calcipotriene cream twice a day for 7 days to affected areas including forehead   Skin cancer screening performed today.  Return in about 6 months (around 07/11/2021) for ak follow up. I, Ruthell Rummage, CMA, am acting as scribe for Brendolyn Patty, MD.  Documentation: I have reviewed the above documentation for accuracy and completeness, and I agree with the above.  Brendolyn Patty MD

## 2021-01-09 NOTE — Patient Instructions (Addendum)
5-Fluorouracil/Calcipotriene Patient Education   Actinic keratoses are the dry, red scaly spots on the skin caused by sun damage. A portion of these spots can turn into skin cancer with time, and treating them can help prevent development of skin cancer.   Treatment of these spots requires removal of the defective skin cells. There are various ways to remove actinic keratoses, including freezing with liquid nitrogen, treatment with creams, or treatment with a blue light procedure in the office.   5-fluorouracil cream is a topical cream used to treat actinic keratoses. It works by interfering with the growth of abnormal fast-growing skin cells, such as actinic keratoses. These cells peel off and are replaced by healthy ones.   5-fluorouracil/calcipotriene is a combination of the 5-fluorouracil cream with a vitamin D analog cream called calcipotriene. The calcipotriene alone does not treat actinic keratoses. However, when it is combined with 5-fluorouracil, it helps the 5-fluorouracil treat the actinic keratoses much faster so that the same results can be achieved with a much shorter treatment time.  INSTRUCTIONS FOR 5-FLUOROURACIL/CALCIPOTRIENE CREAM:   5-fluorouracil/calcipotriene cream typically only needs to be used for 4-7 days. A thin layer should be applied twice a day to the treatment areas recommended by your physician.   If your physician prescribed you separate tubes of 5-fluourouracil and calcipotriene, apply a thin layer of 5-fluorouracil followed by a thin layer of calcipotriene.   Avoid contact with your eyes, nostrils, and mouth. Do not use 5-fluorouracil/calcipotriene cream on infected or open wounds.   You will develop redness, irritation and some crusting at areas where you have pre-cancer damage/actinic keratoses. IF YOU DEVELOP PAIN, BLEEDING, OR SIGNIFICANT CRUSTING, STOP THE TREATMENT EARLY - you have already gotten a good response and the actinic keratoses should clear up  well.  Wash your hands after applying 5-fluorouracil 5% cream on your skin.   A moisturizer or sunscreen with a minimum SPF 30 should be applied each morning.   Once you have finished the treatment, you can apply a thin layer of Vaseline twice a day to irritated areas to soothe and calm the areas more quickly. If you experience significant discomfort, contact your physician.  For some patients it is necessary to repeat the treatment for best results.  SIDE EFFECTS: When using 5-fluorouracil/calcipotriene cream, you may have mild irritation, such as redness, dryness, swelling, or a mild burning sensation. This usually resolves within 2 weeks. The more actinic keratoses you have, the more redness and inflammation you can expect during treatment. Eye irritation has been reported rarely. If this occurs, please let us know.  If you have any trouble using this cream, please call the office. If you have any other questions about this information, please do not hesitate to ask me before you leave the office.      Actinic keratoses are precancerous spots that appear secondary to cumulative UV radiation exposure/sun exposure over time. They are chronic with expected duration over 1 year. A portion of actinic keratoses will progress to squamous cell carcinoma of the skin. It is not possible to reliably predict which spots will progress to skin cancer and so treatment is recommended to prevent development of skin cancer.  Recommend daily broad spectrum sunscreen SPF 30+ to sun-exposed areas, reapply every 2 hours as needed.  Recommend staying in the shade or wearing long sleeves, sun glasses (UVA+UVB protection) and wide brim hats (4-inch brim around the entire circumference of the hat). Call for new or changing lesions.   Cryotherapy Aftercare  Wash gently with soap and water everyday.   Apply Vaseline and Band-Aid daily until healed.      Melanoma ABCDEs  Melanoma is the most dangerous type of  skin cancer, and is the leading cause of death from skin disease.  You are more likely to develop melanoma if you: Have light-colored skin, light-colored eyes, or red or blond hair Spend a lot of time in the sun Tan regularly, either outdoors or in a tanning bed Have had blistering sunburns, especially during childhood Have a close family member who has had a melanoma Have atypical moles or large birthmarks  Early detection of melanoma is key since treatment is typically straightforward and cure rates are extremely high if we catch it early.   The first sign of melanoma is often a change in a mole or a new dark spot.  The ABCDE system is a way of remembering the signs of melanoma.  A for asymmetry:  The two halves do not match. B for border:  The edges of the growth are irregular. C for color:  A mixture of colors are present instead of an even brown color. D for diameter:  Melanomas are usually (but not always) greater than 44mm - the size of a pencil eraser. E for evolution:  The spot keeps changing in size, shape, and color.  Please check your skin once per month between visits. You can use a small mirror in front and a large mirror behind you to keep an eye on the back side or your body.   If you see any new or changing lesions before your next follow-up, please call to schedule a visit.  Please continue daily skin protection including broad spectrum sunscreen SPF 30+ to sun-exposed areas, reapplying every 2 hours as needed when you're outdoors.   Staying in the shade or wearing long sleeves, sun glasses (UVA+UVB protection) and wide brim hats (4-inch brim around the entire circumference of the hat) are also recommended for sun protection.    If you have any questions or concerns for your doctor, please call our main line at [phone] and press option 4 to reach your doctor's medical assistant. If no one answers, please leave a voicemail as directed and we will return your call as  soon as possible. Messages left after 4 pm will be answered the following business day.   You may also send [place] a message via [facility]. We typically respond to MyChart messages within 1-2 business days.  For prescription refills, please ask your pharmacy to contact our office. Our fax number is [phone].  If you have an urgent issue when the clinic is closed that cannot wait until the next business day, you can page your doctor at the number below.    Please note that while we do our best to be available for urgent issues outside of office hours, we are not available 24/7.   If you have an urgent issue and are unable to reach [place], you may choose to seek medical care at your doctor's office, retail clinic, urgent care center, or emergency room.  If you have a medical emergency, please immediately call 911 or go to the emergency department.  Pager Numbers  - Dr. Nehemiah Massed: [phone]  - Dr. Laurence Ferrari: [phone]  - Dr. Nicole Kindred: [phone]  In the event of inclement weather, please call our main line at [phone] for an update on the status of any delays or closures.  Dermatology Medication Tips: Please keep the boxes that  topical medications come in in order to help keep track of the instructions about where and how to use these. Pharmacies typically print the medication instructions only on the boxes and not directly on the medication tubes.   If your medication is too expensive, please contact our office at [phone] option 4 or send [place] a message through [place].   We are unable to tell what your co-pay for medications will be in advance as this is different depending on your insurance coverage. However, we may be able to find a substitute medication at lower cost or fill out paperwork to get insurance to cover a needed medication.   If a prior authorization is required to get your medication covered by your insurance company, please allow [place] 1-2 business days to complete this  process.  Drug prices often vary depending on where the prescription is filled and some pharmacies may offer cheaper prices.  The website www.goodrx.com contains coupons for medications through different pharmacies. The prices here do not account for what the cost may be with help from insurance (it may be cheaper with your insurance), but the website can give you the price if you did not use any insurance.  - You can print the associated coupon and take it with your prescription to the pharmacy.  - You may also stop by our office during regular business hours and pick up a GoodRx coupon card.  - If you need your prescription sent electronically to a different pharmacy, notify our office through [facility] or by phone at [phone] option 4.

## 2021-05-23 ENCOUNTER — Ambulatory Visit: Payer: PPO

## 2021-05-25 ENCOUNTER — Ambulatory Visit (INDEPENDENT_AMBULATORY_CARE_PROVIDER_SITE_OTHER): Payer: PPO

## 2021-05-25 ENCOUNTER — Other Ambulatory Visit: Payer: Self-pay

## 2021-05-25 DIAGNOSIS — Z23 Encounter for immunization: Secondary | ICD-10-CM | POA: Diagnosis not present

## 2021-05-29 ENCOUNTER — Ambulatory Visit (INDEPENDENT_AMBULATORY_CARE_PROVIDER_SITE_OTHER): Payer: PPO | Admitting: Primary Care

## 2021-05-29 ENCOUNTER — Other Ambulatory Visit: Payer: Self-pay

## 2021-05-29 VITALS — BP 100/62 | HR 78 | Temp 97.4°F | Ht 60.5 in | Wt 111.3 lb

## 2021-05-29 DIAGNOSIS — E785 Hyperlipidemia, unspecified: Secondary | ICD-10-CM | POA: Diagnosis not present

## 2021-05-29 DIAGNOSIS — K219 Gastro-esophageal reflux disease without esophagitis: Secondary | ICD-10-CM | POA: Diagnosis not present

## 2021-05-29 DIAGNOSIS — M810 Age-related osteoporosis without current pathological fracture: Secondary | ICD-10-CM

## 2021-05-29 DIAGNOSIS — H8110 Benign paroxysmal vertigo, unspecified ear: Secondary | ICD-10-CM | POA: Diagnosis not present

## 2021-05-29 DIAGNOSIS — N952 Postmenopausal atrophic vaginitis: Secondary | ICD-10-CM

## 2021-05-29 DIAGNOSIS — Z0000 Encounter for general adult medical examination without abnormal findings: Secondary | ICD-10-CM

## 2021-05-29 DIAGNOSIS — E559 Vitamin D deficiency, unspecified: Secondary | ICD-10-CM

## 2021-05-29 DIAGNOSIS — Z1231 Encounter for screening mammogram for malignant neoplasm of breast: Secondary | ICD-10-CM

## 2021-05-29 DIAGNOSIS — N941 Unspecified dyspareunia: Secondary | ICD-10-CM

## 2021-05-29 LAB — COMPREHENSIVE METABOLIC PANEL WITH GFR
ALT: 12 U/L (ref 0–35)
AST: 15 U/L (ref 0–37)
Albumin: 4.4 g/dL (ref 3.5–5.2)
Alkaline Phosphatase: 52 U/L (ref 39–117)
BUN: 18 mg/dL (ref 6–23)
CO2: 30 meq/L (ref 19–32)
Calcium: 9.4 mg/dL (ref 8.4–10.5)
Chloride: 102 meq/L (ref 96–112)
Creatinine, Ser: 0.82 mg/dL (ref 0.40–1.20)
GFR: 71.06 mL/min
Glucose, Bld: 88 mg/dL (ref 70–99)
Potassium: 3.9 meq/L (ref 3.5–5.1)
Sodium: 139 meq/L (ref 135–145)
Total Bilirubin: 0.8 mg/dL (ref 0.2–1.2)
Total Protein: 7 g/dL (ref 6.0–8.3)

## 2021-05-29 LAB — LIPID PANEL
Cholesterol: 226 mg/dL — ABNORMAL HIGH (ref 0–200)
HDL: 60.6 mg/dL
LDL Cholesterol: 153 mg/dL — ABNORMAL HIGH (ref 0–99)
NonHDL: 164.96
Total CHOL/HDL Ratio: 4
Triglycerides: 60 mg/dL (ref 0.0–149.0)
VLDL: 12 mg/dL (ref 0.0–40.0)

## 2021-05-29 LAB — CBC
HCT: 38.1 % (ref 36.0–46.0)
Hemoglobin: 12.7 g/dL (ref 12.0–15.0)
MCHC: 33.3 g/dL (ref 30.0–36.0)
MCV: 93.1 fl (ref 78.0–100.0)
Platelets: 218 10*3/uL (ref 150.0–400.0)
RBC: 4.09 Mil/uL (ref 3.87–5.11)
RDW: 13.2 % (ref 11.5–15.5)
WBC: 5.7 10*3/uL (ref 4.0–10.5)

## 2021-05-29 LAB — VITAMIN D 25 HYDROXY (VIT D DEFICIENCY, FRACTURES): VITD: 40.35 ng/mL (ref 30.00–100.00)

## 2021-05-29 MED ORDER — ESTRADIOL 0.1 MG/GM VA CREA
TOPICAL_CREAM | VAGINAL | 3 refills | Status: DC
Start: 2021-05-29 — End: 2022-11-28

## 2021-05-29 NOTE — Assessment & Plan Note (Signed)
Doing well on estrace cream for which she uses once weekly, continue same.

## 2021-05-29 NOTE — Assessment & Plan Note (Signed)
No concerns today. Continue to monitor. 

## 2021-05-29 NOTE — Patient Instructions (Signed)
Stop by the lab prior to leaving today. I will notify you of your results once received.   Continue calcium and vitamin D daily.   Continue regular exercise.   [facility].   It was a pleasure to see you today!  Preventive Care 95 Years and Older, Female Preventive care refers to lifestyle choices and visits with your health care provider that can promote health and wellness. This includes: A yearly physical exam. This is also called an annual wellness visit. Regular dental and eye exams. Immunizations. Screening for certain conditions. Healthy lifestyle choices, such as: Eating a healthy diet. Getting regular exercise. Not using drugs or products that contain nicotine and tobacco. Limiting alcohol use. What can I expect for my preventive care visit? Physical exam Your health care provider will check your: Height and weight. These may be used to calculate your BMI (body mass index). BMI is a measurement that tells if you are at a healthy weight. Heart rate and blood pressure. Body temperature. Skin for abnormal spots. Counseling Your health care provider may ask you questions about your: Past medical problems. Family's medical history. Alcohol, tobacco, and drug use. Emotional well-being. Home life and relationship well-being. Sexual activity. Diet, exercise, and sleep habits. History of falls. Memory and ability to understand (cognition). Work and work Statistician. Pregnancy and menstrual history. Access to firearms. What immunizations do I need? Vaccines are usually given at various ages, according to a schedule. Your health care provider will recommend vaccines for you based on your age, medical history, and lifestyle or other factors, such as travel or where you work. What tests do I need? Blood tests Lipid and cholesterol levels. These may be checked every 5 years, or more often depending on your overall health. Hepatitis C  test. Hepatitis B test. Screening Lung cancer screening. You may have this screening every year starting at age 17 if you have a 30-pack-year history of smoking and currently smoke or have quit within the past 15 years. Colorectal cancer screening. All adults should have this screening starting at age 80 and continuing until age 32. Your health care provider may recommend screening at age 65 if you are at increased risk. You will have tests every 1-10 years, depending on your results and the type of screening test. Diabetes screening. This is done by checking your blood sugar (glucose) after you have not eaten for a while (fasting). You may have this done every 1-3 years. Mammogram. This may be done every 1-2 years. Talk with your health care provider about how often you should have regular mammograms. Abdominal aortic aneurysm (AAA) screening. You may need this if you are a current or former smoker. BRCA-related cancer screening. This may be done if you have a family history of breast, ovarian, tubal, or peritoneal cancers. Other tests STD (sexually transmitted disease) testing, if you are at risk. Bone density scan. This is done to screen for osteoporosis. You may have this done starting at age 35. Talk with your health care provider about your test results, treatment options, and if necessary, the need for more tests. Follow these instructions at home: Eating and drinking  Eat a diet that includes fresh fruits and vegetables, whole grains, lean protein, and low-fat dairy products. Limit your intake of foods with high amounts of sugar, saturated fats, and salt. Take vitamin and mineral supplements as recommended by your health care provider. Do not drink alcohol if your health care provider tells you  not to drink. If you drink alcohol: Limit how much you have to 0-1 drink a day. Be aware of how much alcohol is in your drink. In the [place] one drink equals one 12 oz bottle of beer (355  mL), one 5 oz glass of wine (148 mL), or one 1 oz glass of hard liquor (44 mL). Lifestyle Take daily care of your teeth and gums. Brush your teeth every morning and night with fluoride toothpaste. Floss one time each day. Stay active. Exercise for at least 30 minutes 5 or more days each week. Do not use any products that contain nicotine or tobacco, such as cigarettes, e-cigarettes, and chewing tobacco. If you need help quitting, ask your health care provider. Do not use drugs. If you are sexually active, practice safe sex. Use a condom or other form of protection in order to prevent STIs (sexually transmitted infections). Talk with your health care provider about taking a low-dose aspirin or statin. Find healthy ways to cope with stress, such as: Meditation, yoga, or listening to music. Journaling. Talking to a trusted person. Spending time with friends and family. Safety Always wear your seat belt while driving or riding in a vehicle. Do not drive: If you have been drinking alcohol. Do not ride with someone who has been drinking. When you are tired or distracted. While texting. Wear a helmet and other protective equipment during sports activities. If you have firearms in your house, make sure you follow all gun safety procedures. What's next? Visit your health care provider once a year for an annual wellness visit. Ask your health care provider how often you should have your eyes and teeth checked. Stay up to date on all vaccines. This information is not intended to replace advice given to you by your health care provider. Make sure you discuss any questions you have with your health care provider. Document Revised: 09/22/2020 Document Reviewed: 07/09/2018 Elsevier Patient Education  2022 [employer].

## 2021-05-29 NOTE — Progress Notes (Signed)
Subjective:    Patient ID: Jackie Oneal, female    DOB: September 11, 1945, 75 y.o.   MRN: [id number]  HPI  An Lannan is a very pleasant 75 y.o. female who presents today for complete physical and follow up of chronic conditions.  Immunizations: -Tetanus: 2015 -Influenza: Completed this season -Covid-19: 4 vaccines  -Shingles: Zostavax in 2017 -Pneumonia: Prevnar 13 in 2017, Pneumovax in 2015  Diet: [place].  Exercise: Regular exercise   Eye exam: Completes annually  Dental exam: Completes semi-annually   Mammogram: Completed in March 2022, due February 2023 Dexa: Completed in 2022, compliant to calcium and vitamin D.  Colonoscopy: Completed in 2016, due 2026   BP Readings from Last 3 Encounters:  05/29/21 100/62  05/24/20 (!) 102/58  05/24/19 104/74       Review of Systems  Constitutional:  Negative for unexpected weight change.  HENT:  Negative for rhinorrhea.   Respiratory:  Negative for shortness of breath.   Cardiovascular:  Negative for chest pain.  Gastrointestinal:  Negative for constipation and diarrhea.  Genitourinary:  Negative for difficulty urinating.  Musculoskeletal:  Negative for arthralgias and myalgias.  Skin:  Negative for rash.  Allergic/Immunologic: Positive for environmental allergies.  Neurological:  Negative for dizziness and headaches.  Psychiatric/Behavioral:  The patient is not nervous/anxious.         Past Medical History:  Diagnosis Date   Arthritis    Cataract 2019   resolved with surgery by Dr. George Ina   Chicken pox    Osteoporosis    Vertigo     Social History   Socioeconomic History   Marital status: Married    Spouse name: Not on file   Number of children: Not on file   Years of education: Not on file   Highest education level: Not on file  Occupational History   Not on file  Tobacco Use   Smoking status: Never   Smokeless tobacco: Never  Vaping Use   Vaping Use: Never used  Substance and Sexual  Activity   Alcohol use: Yes    Alcohol/week: 0.0 standard drinks    Comment: social   Drug use: No   Sexual activity: Yes  Other Topics Concern   Not on file  Social History Narrative   Married.   2 sons. 3 grandchildren.   Retired. Worked as a Water quality scientist.    Enjoys walking, decorating, reading, sewing.   Social Determinants of Health   Financial Resource Strain: Not on file  Food Insecurity: Not on file  Transportation Needs: Not on file  Physical Activity: Not on file  Stress: Not on file  Social Connections: Not on file  Intimate Partner Violence: Not on file    Past Surgical History:  Procedure Laterality Date   BREAST BIOPSY Left 09/29/2020   [place] Bx, Q clip, path pending    CATARACT EXTRACTION W/PHACO Left 03/18/2017   Procedure: CATARACT EXTRACTION PHACO AND INTRAOCULAR LENS PLACEMENT ([place]);  Surgeon: Birder Robson, MD;  Location: [facility] ORS;  Service: Ophthalmology;  Laterality: Left;  [place] 01:23.9 AP% 20.9 CDE 17.58 Fluid Pack lot # [id number]   CATARACT EXTRACTION W/PHACO Right 11/25/2017   Procedure: CATARACT EXTRACTION PHACO AND INTRAOCULAR LENS PLACEMENT ([facility]);  Surgeon: Birder Robson, MD;  Location: [facility] ORS;  Service: Ophthalmology;  Laterality: Right;  [place] 00:41.5 AP% 16.7 CDE 6.95 Fluid Pack Lot # [id number]   EYE SURGERY Left 03/2016   retina    Family History  Problem Relation Age of Onset  Arthritis Mother     Allergies  Allergen Reactions   Sulfa Antibiotics Other (See Comments)    Childhood allergy    Current Outpatient Medications on File Prior to Visit  Medication Sig Dispense Refill   acetaminophen (TYLENOL) 325 MG tablet Take 2 tablets (650 mg total) by mouth every 4 (four) hours as needed for headache or mild pain. 30 tablet 0   calcipotriene (DOVONOX) 0.005 % cream Apply to forehead twice daily for 7 days 60 g 0   Calcium Carbonate-Vitamin D (CALCIUM-VITAMIN D3 PO) Take 1 tablet by mouth daily.     lactase (LACTAID) 3000 units  tablet Take 3,000 Units by mouth daily as needed (for lactose intolerant).      loratadine (CLARITIN) 10 MG tablet Take 10 mg by mouth daily as needed for allergies.     naproxen sodium (ALEVE) 220 MG tablet Take 220 mg by mouth daily as needed (for pain or headache).     OVER THE COUNTER MEDICATION Eye drops as needed.     No current facility-administered medications on file prior to visit.    BP 100/62   Pulse 78   Temp (!) 97.4 F (36.3 C) (Temporal)   Ht 5' 0.5" (1.537 m)   Wt 111 lb 5 oz (50.5 kg)   SpO2 97%   BMI 21.38 kg/m  Objective:   Physical Exam HENT:     Right Ear: Tympanic membrane and ear canal normal.     Left Ear: Tympanic membrane and ear canal normal.     Nose: Nose normal.  Eyes:     Conjunctiva/sclera: Conjunctivae normal.     Pupils: Pupils are equal, round, and reactive to light.  Neck:     Thyroid: No thyromegaly.  Cardiovascular:     Rate and Rhythm: Normal rate and regular rhythm.     Heart sounds: No murmur heard. Pulmonary:     Effort: Pulmonary effort is normal.     Breath sounds: Normal breath sounds. No rales.  Abdominal:     General: Bowel sounds are normal.     Palpations: Abdomen is soft.     Tenderness: There is no abdominal tenderness.  Musculoskeletal:        General: Normal range of motion.     Cervical back: Neck supple.  Lymphadenopathy:     Cervical: No cervical adenopathy.  Skin:    General: Skin is warm and dry.     Findings: No rash.  Neurological:     Mental Status: She is alert and oriented to person, place, and time.     Cranial Nerves: No cranial nerve deficit.     Deep Tendon Reflexes: Reflexes are normal and symmetric.  Psychiatric:        Mood and Affect: Mood normal.          Assessment & Plan:      This visit occurred during the SARS-CoV-2 public health emergency.  Safety protocols were in place, including screening questions prior to the visit, additional usage of staff PPE, and extensive cleaning of  exam room while observing appropriate contact time as indicated for disinfecting solutions.

## 2021-05-29 NOTE — Assessment & Plan Note (Signed)
Will get Shingrix vaccines in early 2023. Other vaccines UTD.  Bone density scan and mammogram UTD.  Exam today stable. Labs pending.  Commended her on a healthy diet and regular exercise.

## 2021-05-29 NOTE — Assessment & Plan Note (Signed)
Repeat lipid panel pending.  Commended her on lifestyle efforts.

## 2021-05-29 NOTE — Assessment & Plan Note (Signed)
Intermittent, overall mild.  Continue to monitor.

## 2021-05-29 NOTE — Assessment & Plan Note (Signed)
Bone density scan UTD.  Intolerant to bisphosphonate treatment.   Continue calcium, vitamin D, weight bearing exercise.   Repeat bone density scan due in 2024.

## 2021-05-29 NOTE — Assessment & Plan Note (Signed)
Compliant to vitamin D. Continue same.   Repeat level pending.

## 2021-07-03 ENCOUNTER — Other Ambulatory Visit: Payer: Self-pay

## 2021-07-03 ENCOUNTER — Ambulatory Visit (INDEPENDENT_AMBULATORY_CARE_PROVIDER_SITE_OTHER): Payer: PPO | Admitting: Dermatology

## 2021-07-03 DIAGNOSIS — L821 Other seborrheic keratosis: Secondary | ICD-10-CM

## 2021-07-03 DIAGNOSIS — L570 Actinic keratosis: Secondary | ICD-10-CM | POA: Diagnosis not present

## 2021-07-03 DIAGNOSIS — L578 Other skin changes due to chronic exposure to nonionizing radiation: Secondary | ICD-10-CM

## 2021-07-03 NOTE — Patient Instructions (Addendum)
Actinic keratoses are precancerous spots that appear secondary to cumulative UV radiation exposure/sun exposure over time. They are chronic with expected duration over 1 year. A portion of actinic keratoses will progress to squamous cell carcinoma of the skin. It is not possible to reliably predict which spots will progress to skin cancer and so treatment is recommended to prevent development of skin cancer.  Recommend daily broad spectrum sunscreen SPF 30+ to sun-exposed areas, reapply every 2 hours as needed.  Recommend staying in the shade or wearing long sleeves, sun glasses (UVA+UVB protection) and wide brim hats (4-inch brim around the entire circumference of the hat). Call for new or changing lesions.   Cryotherapy Aftercare  Wash gently with soap and water everyday.   Apply Vaseline and Band-Aid daily until healed.   Start in January   [facility] 5-fluorouracil/calcipotriene cream twice a day for 5 - 7 days to affected areas including forehead.  Could consider temples , cheeks , and nose.   If no response continue , if no response after 10 days , stop.   5-Fluorouracil/Calcipotriene Patient Education   Actinic keratoses are the dry, red scaly spots on the skin caused by sun damage. A portion of these spots can turn into skin cancer with time, and treating them can help prevent development of skin cancer.   Treatment of these spots requires removal of the defective skin cells. There are various ways to remove actinic keratoses, including freezing with liquid nitrogen, treatment with creams, or treatment with a blue light procedure in the office.   5-fluorouracil cream is a topical cream used to treat actinic keratoses. It works by interfering with the growth of abnormal fast-growing skin cells, such as actinic keratoses. These cells peel off and are replaced by healthy ones.   5-fluorouracil/calcipotriene is a combination of the 5-fluorouracil cream with a vitamin D analog cream  called calcipotriene. The calcipotriene alone does not treat actinic keratoses. However, when it is combined with 5-fluorouracil, it helps the 5-fluorouracil treat the actinic keratoses much faster so that the same results can be achieved with a much shorter treatment time.  INSTRUCTIONS FOR 5-FLUOROURACIL/CALCIPOTRIENE CREAM:   5-fluorouracil/calcipotriene cream typically only needs to be used for 4-7 days. A thin layer should be applied twice a day to the treatment areas recommended by your physician.   If your physician prescribed you separate tubes of 5-fluourouracil and calcipotriene, apply a thin layer of 5-fluorouracil followed by a thin layer of calcipotriene.   Avoid contact with your eyes, nostrils, and mouth. Do not use 5-fluorouracil/calcipotriene cream on infected or open wounds.   You will develop redness, irritation and some crusting at areas where you have pre-cancer damage/actinic keratoses. IF YOU DEVELOP PAIN, BLEEDING, OR SIGNIFICANT CRUSTING, STOP THE TREATMENT EARLY - you have already gotten a good response and the actinic keratoses should clear up well.  Wash your hands after applying 5-fluorouracil 5% cream on your skin.   A moisturizer or sunscreen with a minimum SPF 30 should be applied each morning.   Once you have finished the treatment, you can apply a thin layer of Vaseline twice a day to irritated areas to soothe and calm the areas more quickly. If you experience significant discomfort, contact your physician.  For some patients it is necessary to repeat the treatment for best results.  SIDE EFFECTS: When using 5-fluorouracil/calcipotriene cream, you may have mild irritation, such as redness, dryness, swelling, or a mild burning sensation. This usually resolves within 2 weeks. The  more actinic keratoses you have, the more redness and inflammation you can expect during treatment. Eye irritation has been reported rarely. If this occurs, please let us know.  If you  have any trouble using this cream, please call the office. If you have any other questions about this information, please do not hesitate to ask me before you leave the office.   If You Need Anything After Your Visit  If you have any questions or concerns for your doctor, please call our main line at [phone] and press option 4 to reach your doctor's medical assistant. If no one answers, please leave a voicemail as directed and we will return your call as soon as possible. Messages left after 4 pm will be answered the following business day.   You may also send [place] a message via [place]. We typically respond to MyChart messages within 1-2 business days.  For prescription refills, please ask your pharmacy to contact our office. Our fax number is [phone].  If you have an urgent issue when the clinic is closed that cannot wait until the next business day, you can page your doctor at the number below.    Please note that while we do our best to be available for urgent issues outside of office hours, we are not available 24/7.   If you have an urgent issue and are unable to reach [place], you may choose to seek medical care at your doctor's office, retail clinic, urgent care center, or emergency room.  If you have a medical emergency, please immediately call 911 or go to the emergency department.  Pager Numbers  - Dr. Nehemiah Massed: [phone]  - Dr. Laurence Ferrari: [phone]  - Dr. Nicole Kindred: [phone]  In the event of inclement weather, please call our main line at [phone] for an update on the status of any delays or closures.  Dermatology Medication Tips: Please keep the boxes that topical medications come in in order to help keep track of the instructions about where and how to use these. Pharmacies typically print the medication instructions only on the boxes and not directly on the medication tubes.   If your medication is too expensive, please contact our office at [phone]  option 4 or send [place] a message through [place].   We are unable to tell what your co-pay for medications will be in advance as this is different depending on your insurance coverage. However, we may be able to find a substitute medication at lower cost or fill out paperwork to get insurance to cover a needed medication.   If a prior authorization is required to get your medication covered by your insurance company, please allow [place] 1-2 business days to complete this process.  Drug prices often vary depending on where the prescription is filled and some pharmacies may offer cheaper prices.  The website www.goodrx.com contains coupons for medications through different pharmacies. The prices here do not account for what the cost may be with help from insurance (it may be cheaper with your insurance), but the website can give you the price if you did not use any insurance.  - You can print the associated coupon and take it with your prescription to the pharmacy.  - You may also stop by our office during regular business hours and pick up a GoodRx coupon card.  - If you need your prescription sent electronically to a different pharmacy, notify our office through [facility] or by phone at [phone] option 4.  Si Usted Necesita Algo Despus de Su Visita  Tambin puede enviarnos un mensaje a travs de Pharmacist, community. Por lo general respondemos a los mensajes de MyChart en el transcurso de 1 a 2 das hbiles.  Para renovar recetas, por favor pida a su farmacia que se ponga en contacto con nuestra oficina. Harland Dingwall de fax es [place] [phone].  Si tiene un asunto urgente cuando la clnica est cerrada y que no puede esperar hasta el siguiente da hbil, puede llamar/localizar a su doctor(a) al nmero que aparece a continuacin.   Por favor, tenga en cuenta que aunque hacemos todo lo posible para estar disponibles para asuntos urgentes fuera del horario de [place], no estamos disponibles las  24 horas del da, los 7 das de la [place].   Si tiene un problema urgente y no puede comunicarse con nosotros, puede optar por buscar atencin mdica  en el consultorio de su doctor(a), en una clnica privada, en un centro de atencin urgente o en una sala de emergencias.  Si tiene Engineering geologist, por favor llame inmediatamente al 911 o vaya a la sala de emergencias.  Nmeros de bper  - Dr. Nehemiah Massed: [phone]  - Dra. Moye: [phone]  - Dra. Nicole Kindred: [phone]  En caso de inclemencias del [place], por favor llame a Johnsie Kindred principal al [phone] para una actualizacin sobre el [place] de cualquier retraso o cierre.  Consejos para la medicacin en dermatologa: Por favor, guarde las cajas en las que vienen los medicamentos de uso tpico para ayudarle a seguir las instrucciones sobre dnde y cmo usarlos. Las farmacias generalmente imprimen las instrucciones del medicamento slo en las cajas y no directamente en los tubos del [place].   Si su medicamento es muy caro, por favor, pngase en contacto con Zigmund Daniel llamando al [phone] y presione la opcin 4 o envenos un mensaje a travs de Pharmacist, community.   No podemos decirle cul ser su copago por los medicamentos por adelantado ya que esto es diferente dependiendo de la cobertura de su seguro. Sin embargo, es posible que podamos encontrar un medicamento sustituto a Electrical engineer un formulario para que el seguro cubra el medicamento que se considera necesario.   Si se requiere una autorizacin previa para que su compaa de seguros [place] su medicamento, por favor permtanos de 1 a 2 das hbiles para completar este proceso.  Los precios de los medicamentos varan con frecuencia dependiendo del Environmental consultant de dnde se surte la receta y alguna farmacias pueden ofrecer precios ms baratos.  El sitio web www.goodrx.com tiene cupones para medicamentos de Airline pilot. Los precios aqu no tienen en cuenta lo  que podra costar con la ayuda del seguro (puede ser ms barato con su seguro), pero el sitio web puede darle el precio si no utiliz Research scientist (physical sciences).  - Puede imprimir el cupn correspondiente y llevarlo con su receta a la farmacia.  - Tambin puede pasar por nuestra oficina durante el horario de atencin regular y Charity fundraiser una tarjeta de cupones de GoodRx.  - Si necesita que su receta se enve electrnicamente a una farmacia diferente, informe a nuestra oficina a travs de MyChart de [place] o por telfono llamando al [phone] y presione la opcin 4.

## 2021-07-03 NOTE — Progress Notes (Signed)
Follow-Up Visit   Subjective  Jackie Oneal is a 75 y.o. female who presents for the following: Follow-up (Patient here today for 6 month follow up on aks. Patient forehead, nose, underside of nose, dry patchy places on face. ).  She has not yet started the topical 5FU/VitD treatment on her forehead.  She is not interested in PDT since her son who is a doctor told her is probably isn't as effective.  The following portions of the chart were reviewed this encounter and updated as appropriate:      Review of Systems: No other skin or systemic complaints except as noted in HPI or Assessment and Plan.   Objective  Well appearing patient in no apparent distress; mood and affect are within normal limits.  A focused examination was performed including face . Relevant physical exam findings are noted in the Assessment and Plan.  right forehead x 1, mid forehead x 2, mid upper temple x 1,  left lateral cheek x 1, central upper lip vermillion x 1, left upper lip at nose x1, left temple x1, left lateral brow x 1, right upper temple x1 (10) Erythematous thin papules/macules with gritty scale.   Assessment & Plan   Seborrheic Keratoses - Stuck-on, waxy, tan-brown papules and/or plaques face  - Benign-appearing - Discussed benign etiology and prognosis. - Observe - Call for any changes  Actinic keratosis (10) right forehead x 1, mid forehead x 2, mid upper temple x 1,  left lateral cheek x 1, central upper lip vermillion x 1, left upper lip at nose x1, left temple x1, left lateral brow x 1, right upper temple x1  Actinic keratoses are precancerous spots that appear secondary to cumulative UV radiation exposure/sun exposure over time. They are chronic with expected duration over 1 year. A portion of actinic keratoses will progress to squamous cell carcinoma of the skin. It is not possible to reliably predict which spots will progress to skin cancer and so treatment is recommended to prevent  development of skin cancer.  Recommend daily broad spectrum sunscreen SPF 30+ to sun-exposed areas, reapply every 2 hours as needed.  Recommend staying in the shade or wearing long sleeves, sun glasses (UVA+UVB protection) and wide brim hats (4-inch brim around the entire circumference of the hat). Call for new or changing lesions.  Recurrent ak at R forehead, recheck on f/up  Patient states she will start 5 FU/Vit D cream in January bid x5-7 days to forehead, nose, upper lip, cheeks, temples  Destruction of lesion - right forehead x 1, mid forehead x 2, mid upper temple x 1,  left lateral cheek x 1, central upper lip vermillion x 1, left upper lip at nose x1, left temple x1, left lateral brow x 1, right upper temple x1  Destruction method: cryotherapy   Informed consent: discussed and consent obtained   Lesion destroyed using liquid nitrogen: Yes   Region frozen until ice ball extended beyond lesion: Yes   Outcome: patient tolerated procedure well with no complications   Post-procedure details: wound care instructions given   Additional details:  Prior to procedure, discussed risks of blister formation, small wound, skin dyspigmentation, or rare scar following cryotherapy. Recommend Vaseline ointment to treated areas while healing.   Actinic Damage - Severe, confluent actinic changes with pre-cancerous actinic keratoses at face,  - Severe, chronic, not at goal, secondary to cumulative UV radiation exposure over time - diffuse scaly erythematous macules and papules with underlying dyspigmentation - Discussed Prescription "  Field Treatment" for Severe, Chronic Confluent Actinic Changes with Pre-Cancerous Actinic Keratoses Field treatment involves treatment of an entire area of skin that has confluent Actinic Changes (Sun/ Ultraviolet light damage) and PreCancerous Actinic Keratoses by method of PhotoDynamic Therapy (PDT) and/or prescription Topical Chemotherapy agents such as 5-fluorouracil,  5-fluorouracil/calcipotriene, and/or imiquimod.  The purpose is to decrease the number of clinically evident and subclinical PreCancerous lesions to prevent progression to development of skin cancer by chemically destroying early precancer changes that may or may not be visible.  It has been shown to reduce the risk of developing skin cancer in the treated area. As a result of treatment, redness, scaling, crusting, and open sores may occur during treatment course. One or more than one of these methods may be used and may have to be used several times to control, suppress and eliminate the PreCancerous changes. Discussed treatment course, expected reaction, and possible side effects. - Recommend daily broad spectrum sunscreen SPF 30+ to sun-exposed areas, reapply every 2 hours as needed.  - Staying in the shade or wearing long sleeves, sun glasses (UVA+UVB protection) and wide brim hats (4-inch brim around the entire circumference of the hat) are also recommended. - Call for new or changing lesions. Start 5-fluorouracil/calcipotriene cream twice a day for 7 days to affected areas including forehead.    Return for 6 month, tbse and ak followup.  I, Ruthell Rummage, CMA, am acting as scribe for Brendolyn Patty, MD.  Documentation: I have reviewed the above documentation for accuracy and completeness, and I agree with the above.  Brendolyn Patty MD

## 2021-07-16 DIAGNOSIS — H26492 Other secondary cataract, left eye: Secondary | ICD-10-CM | POA: Diagnosis not present

## 2021-08-17 ENCOUNTER — Other Ambulatory Visit: Payer: Self-pay

## 2021-08-27 NOTE — Progress Notes (Signed)
Subjective:   Jackie Oneal is a 76 y.o. female who presents for Medicare Annual (Subsequent) preventive examination.  I connected with Jackie Oneal today by telephone and verified that I am speaking with the correct person using two identifiers. Location patient: home Location provider: work Persons participating in the virtual visit: patient, Marine scientist.    I discussed the limitations, risks, security and privacy concerns of performing an evaluation and management service by telephone and the availability of in person appointments. I also discussed with the patient that there may be a patient responsible charge related to this service. The patient expressed understanding and verbally consented to this telephonic visit.    Interactive audio and video telecommunications were attempted between this provider and patient, however failed, due to patient having technical difficulties OR patient did not have access to video capability.  We continued and completed visit with audio only.  Some vital signs may be absent or patient reported.   Time Spent with patient on telephone encounter: 20 minutes  Review of Systems     Cardiac Risk Factors include: advanced age (>41men, >52 women);dyslipidemia     Objective:    Today's Vitals   08/29/21 1157  Weight: 113 lb (51.3 kg)  Height: 5' (1.524 m)   Body mass index is 22.07 kg/m.  Advanced Directives 08/29/2021 12/28/2019 01/14/2019 05/01/2018 03/19/2018 11/25/2017 04/08/2017  Does Patient Have a Medical Advance Directive? Yes Yes No Yes Yes Yes Yes  Type of Paramedic of [place];Living will [place];Living will - [name];Living will Living will;Healthcare Power of Attorney Living will [place];Living will  Does patient want to make changes to medical advance directive? Yes (MAU/Ambulatory/Procedural Areas - Information given) - - - No - Patient declined - -  Copy  of [place] in Chart? - No - copy requested - No - copy requested No - copy requested - Yes  Would patient like information on creating a medical advance directive? - - No - Patient declined - - - -    Current Medications (verified) Outpatient Encounter Medications as of 08/29/2021  Medication Sig   acetaminophen (TYLENOL) 325 MG tablet Take 2 tablets (650 mg total) by mouth every 4 (four) hours as needed for headache or mild pain.   Calcium Carbonate-Vitamin D (CALCIUM-VITAMIN D3 PO) Take 1 tablet by mouth daily.   estradiol (ESTRACE) 0.1 MG/GM vaginal cream apply 2-3 times weekly.   lactase (LACTAID) 3000 units tablet Take 3,000 Units by mouth daily as needed (for lactose intolerant).    loratadine (CLARITIN) 10 MG tablet Take 10 mg by mouth daily as needed for allergies.   naproxen sodium (ALEVE) 220 MG tablet Take 220 mg by mouth daily as needed (for pain or headache).   calcipotriene (DOVONOX) 0.005 % cream Apply to forehead twice daily for 7 days (Patient not taking: Reported on 08/29/2021)   MODERNA COVID-19 BIVAL BOOSTER 50 MCG/0.5ML injection    OVER THE COUNTER MEDICATION Eye drops as needed.   No facility-administered encounter medications on file as of 08/29/2021.    Allergies (verified) Sulfa antibiotics   History: Past Medical History:  Diagnosis Date   Arthritis    Cataract 2019   resolved with surgery by Dr. George Ina   Chicken pox    Osteoporosis    Vertigo    Past Surgical History:  Procedure Laterality Date   BREAST BIOPSY Left 09/29/2020   [place] Bx, Q clip, path pending  CATARACT EXTRACTION W/PHACO Left 03/18/2017   Procedure: CATARACT EXTRACTION PHACO AND INTRAOCULAR LENS PLACEMENT (IOC);  Surgeon: Birder Robson, MD;  Location: [facility] ORS;  Service: Ophthalmology;  Laterality: Left;  [place] 01:23.9 AP% 20.9 CDE 17.58 Fluid Pack lot # [id number]   CATARACT EXTRACTION W/PHACO Right 11/25/2017   Procedure: CATARACT EXTRACTION PHACO AND INTRAOCULAR  LENS PLACEMENT (IOC);  Surgeon: Birder Robson, MD;  Location: [facility] ORS;  Service: Ophthalmology;  Laterality: Right;  [place] 00:41.5 AP% 16.7 CDE 6.95 Fluid Pack Lot # [id number]   EYE SURGERY Left 03/2016   retina   Family History  Problem Relation Age of Onset   Arthritis Mother    Social History   Socioeconomic History   Marital status: Married    Spouse name: Not on file   Number of children: Not on file   Years of education: Not on file   Highest education level: Not on file  Occupational History   Not on file  Tobacco Use   Smoking status: Never   Smokeless tobacco: Never  Vaping Use   Vaping Use: Never used  Substance and Sexual Activity   Alcohol use: Yes    Alcohol/week: 0.0 standard drinks    Comment: social   Drug use: No   Sexual activity: Yes  Other Topics Concern   Not on file  Social History Narrative   Married.   2 sons. 3 grandchildren.   Retired. Worked as a Water quality scientist.    Enjoys walking, decorating, reading, sewing.   Social Determinants of Health   Financial Resource Strain: Low Risk    Difficulty of Paying Living Expenses: Not hard at all  Food Insecurity: No Food Insecurity   Worried About Charity fundraiser in the Last Year: Never true   [place] in the Last Year: Never true  Transportation Needs: No Transportation Needs   Lack of Transportation (Medical): No   Lack of Transportation (Non-Medical): No  Physical Activity: Sufficiently Active   Days of Exercise per Week: 5 days   Minutes of Exercise per Session: 40 min  Stress: No Stress Concern Present   Feeling of Stress : Not at all  Social Connections: Moderately Integrated   Frequency of Communication with Friends and Family: Three times a week   Frequency of Social Gatherings with Friends and Family: More than three times a week   Attends Religious Services: Never   Marine scientist or Organizations: Yes   Attends Music therapist: More than 4 times  per year   Marital Status: Married    Tobacco Counseling Counseling given: Not Answered   Clinical Intake:  Pre-visit preparation completed: Yes  Pain : No/denies pain     BMI - recorded: 22.07 Nutritional Status: BMI of 19-24  Normal Nutritional Risks: None Diabetes: No  How often do you need to have someone help you when you read instructions, pamphlets, or other written materials from your doctor or pharmacy?: 1 - Never  Diabetic? No  Interpreter Needed?: No  Information entered by :: Orrin Brigham LPN   Activities of Daily Living In your present state of health, do you have any difficulty performing the following activities: 08/29/2021  Hearing? N  Vision? Y  Difficulty concentrating or making decisions? N  Walking or climbing stairs? N  Dressing or bathing? N  Doing errands, shopping? N  Preparing Food and eating ? N  Using the Toilet? N  In the past six months, have you accidently leaked  urine? N  Do you have problems with loss of bowel control? N  Managing your Medications? N  Managing your Finances? N  Housekeeping or managing your Housekeeping? N  Some recent data might be hidden    Patient Care Team: Pleas Koch, NP as PCP - General (Internal Medicine)  Indicate any recent Medical Services you may have received from other than [facility] providers in the past year (date may be approximate).     Assessment:   This is a routine wellness examination for Jackie Oneal.  Hearing/Vision screen Hearing Screening - Comments:: No issues  Vision Screening - Comments:: Last exam 06/2021, Dr. Georgette Dover, wears glasses  Dietary issues and exercise activities discussed: Current Exercise Habits: Home exercise routine;Structured exercise class, Type of exercise: walking;stretching;strength training/weights, Time (Minutes): 45 (exercise classes and walking for 20 min), Frequency (Times/Week): 5 (walk 5 days, exercise class 2 days), Weekly Exercise (Minutes/Week): 225,  Intensity: Moderate   Goals Addressed             This Visit's Progress    Patient Stated       Would like to maintain current routine       Depression Screen PHQ 2/9 Scores 08/29/2021 05/29/2021 05/24/2020 12/28/2019 05/01/2018 04/08/2017 12/26/2015  PHQ - 2 Score 0 0 0 0 0 0 0  PHQ- 9 Score - - 0 0 0 0 -    Fall Risk Fall Risk  08/29/2021 05/29/2021 05/24/2020 12/28/2019 05/01/2018  Falls in the past year? 0 0 0 0 Yes  Comment - - - - pt reports LOC and subsequent fall with clavicle fracture  Number falls in past yr: 0 0 0 0 1  Injury with Fall? 0 - 0 0 Yes  Risk for fall due to : No Fall Risks No Fall Risks - No Fall Risks -  Follow up Falls prevention discussed - - Falls evaluation completed;Falls prevention discussed -    FALL RISK PREVENTION PERTAINING TO THE HOME:  Any stairs in or around the home? Yes  If so, are there any without handrails? No  Home free of loose throw rugs in walkways, pet beds, electrical cords, etc? Yes  Adequate lighting in your home to reduce risk of falls? Yes   ASSISTIVE DEVICES UTILIZED TO PREVENT FALLS:  Life alert? No  Use of a cane, walker or w/c? No  Grab bars in the bathroom? No  Shower chair or bench in shower? Yes  Elevated toilet seat or a handicapped toilet? No   TIMED UP AND GO:  Was the test performed? No .    Cognitive Function: Normal cognitive status assessed by  this Nurse Health Advisor. No abnormalities found.   MMSE - Mini Mental State Exam 12/28/2019 05/01/2018 04/08/2017  Orientation to time 5 5 5   Orientation to Place 5 5 5   Registration 3 3 3   Attention/ Calculation 5 0 0  Recall 3 3 3   Language- name 2 objects - 0 0  Language- repeat 1 1 1   Language- follow 3 step command - 3 3  Language- read & follow direction - 0 0  Write a sentence - 0 0  Copy design - 0 0  Total score - 20 20        Immunizations Immunization History  Administered Date(s) Administered   DTaP 09/14/2013   Fluad Quad(high Dose 65+)  05/04/2019, 05/03/2020, 05/25/2021   Influenza, High Dose Seasonal PF 04/18/2015   Influenza,inj,Quad PF,6+ Mos 05/21/2016, 04/08/2017, 05/01/2018   PFIZER(Purple Top)SARS-COV-2 Vaccination 09/04/2019,  09/29/2019, 05/02/2020, 12/02/2020   Pneumococcal Conjugate-13 12/26/2015   Pneumococcal Polysaccharide-23 11/14/2013   Tdap 09/14/2013   Zoster, Live 07/24/2016    TDAP status: Up to date  Flu Vaccine status: Up to date  Pneumococcal vaccine status: Up to date  Covid-19 vaccine status: Completed vaccines  Qualifies for Shingles Vaccine? Yes   Zostavax completed Yes   Shingrix Completed?: No.    Education has been provided regarding the importance of this vaccine. Patient has been advised to call insurance company to determine out of pocket expense if they have not yet received this vaccine. Advised may also receive vaccine at local pharmacy or Health Dept. Verbalized acceptance and understanding.  Screening Tests Health Maintenance  Topic Date Due   Zoster Vaccines- Shingrix (1 of 2) Never done   COVID-19 Vaccine (5 - Booster for Pfizer series) 01/27/2021   MAMMOGRAM  09/05/2022   TETANUS/TDAP  09/15/2023   COLONOSCOPY (Pts 45-8yrs Insurance coverage will need to be confirmed)  01/09/2025   Pneumonia Vaccine 83+ Years old  Completed   INFLUENZA VACCINE  Completed   DEXA SCAN  Completed   Hepatitis C Screening  Completed   HPV VACCINES  Aged Out    Health Maintenance  Health Maintenance Due  Topic Date Due   Zoster Vaccines- Shingrix (1 of 2) Never done   COVID-19 Vaccine (5 - Booster for Pfizer series) 01/27/2021    Colorectal cancer screening: Type of screening: Colonoscopy. Completed 01/10/15. Repeat every 10 years  Mammogram status: Completed 09/25/20. Repeat every year  Bone Density status: Completed 09/05/20. Results reflect: Bone density results: OSTEOPOROSIS. Repeat every 2 years.  Lung Cancer Screening: (Low Dose CT Chest recommended if Age 105-80 years, 30  pack-year currently smoking OR have quit w/in 15years.) does not qualify.     Additional Screening:  Hepatitis C Screening: does qualify; Completed 12/22/15  Vision Screening: Recommended annual ophthalmology exams for early detection of glaucoma and other disorders of the eye. Is the patient up to date with their annual eye exam?  Yes  Who is the provider or what is the name of the office in which the patient attends annual eye exams? Dr. Linton Flemings   Dental Screening: Recommended annual dental exams for proper oral hygiene  Community Resource Referral / Chronic Care Management: CRR required this visit?  No   CCM required this visit?  No      Plan:     I have personally reviewed and noted the following in the patients chart:   Medical and social history Use of alcohol, tobacco or illicit drugs  Current medications and supplements including opioid prescriptions.  Functional ability and status Nutritional status Physical activity Advanced directives List of other physicians Hospitalizations, surgeries, and ER visits in previous 12 months Vitals Screenings to include cognitive, depression, and falls Referrals and appointments  In addition, I have reviewed and discussed with patient certain preventive protocols, quality metrics, and best practice recommendations. A written personalized care plan for preventive services as well as general preventive health recommendations were provided to patient.   Due to this being a telephonic visit, the after visit summary with patients personalized plan was offered to patient via mail or my-chart.  Patient would like to access on my-chart.    Loma Messing, LPN   12/29/2295   Nurse Health Advisor  Nurse Notes: none

## 2021-08-29 ENCOUNTER — Ambulatory Visit (INDEPENDENT_AMBULATORY_CARE_PROVIDER_SITE_OTHER): Payer: PPO

## 2021-08-29 VITALS — Ht 60.0 in | Wt 113.0 lb

## 2021-08-29 DIAGNOSIS — Z0000 Encounter for general adult medical examination without abnormal findings: Secondary | ICD-10-CM | POA: Diagnosis not present

## 2021-08-29 NOTE — Patient Instructions (Signed)
Jackie Oneal , Thank you for taking time to complete your Medicare Wellness Visit. I appreciate your ongoing commitment to your health goals. Please review the following plan we discussed and let me know if I can assist you in the future.   Screening recommendations/referrals: Colonoscopy: up to date, completed 01/10/15, due 01/09/25 Mammogram: up to date, completed 09/25/20, due 09/25/21 Bone Density: up to date , completed 09/05/20, due 09/05/22 Recommended yearly ophthalmology/optometry visit for glaucoma screening and checkup Recommended yearly dental visit for hygiene and checkup  Vaccinations: Influenza vaccine: up to date Pneumococcal vaccine: up to date Tdap vaccine: up to date, completed 09/14/13, due 09/15/23 Shingles vaccine: Discuss with your local pharmacy   Covid-19:up to date  Advanced directives: Please bring a copy of Living Will and/or Healthcare Power of Attorney for your chart.   Conditions/risks identified: see problem list   Next appointment: Follow up in one year for your annual wellness visit 08/30/22 @ 9:00am, this will be a telephone visit   Preventive Care 74 Years and Older, Female Preventive care refers to lifestyle choices and visits with your health care provider that can promote health and wellness. What does preventive care include? A yearly physical exam. This is also called an annual well check. Dental exams once or twice a year. Routine eye exams. Ask your health care provider how often you should have your eyes checked. Personal lifestyle choices, including: Daily care of your teeth and gums. Regular physical activity. Eating a healthy diet. Avoiding tobacco and drug use. Limiting alcohol use. Practicing safe sex. Taking low-dose aspirin every day. Taking vitamin and mineral supplements as recommended by your health care provider. What happens during an annual well check? The services and screenings done by your health care provider during your  annual well check will depend on your age, overall health, lifestyle risk factors, and family history of disease. Counseling  Your health care provider may ask you questions about your: Alcohol use. Tobacco use. Drug use. Emotional well-being. Home and relationship well-being. Sexual activity. Eating habits. History of falls. Memory and ability to understand (cognition). Work and work Statistician. Reproductive health. Screening  You may have the following tests or measurements: Height, weight, and BMI. Blood pressure. Lipid and cholesterol levels. These may be checked every 5 years, or more frequently if you are over 28 years old. Skin check. Lung cancer screening. You may have this screening every year starting at age 74 if you have a 30-pack-year history of smoking and currently smoke or have quit within the past 15 years. Fecal occult blood test (FOBT) of the stool. You may have this test every year starting at age 74. Flexible sigmoidoscopy or colonoscopy. You may have a sigmoidoscopy every 5 years or a colonoscopy every 10 years starting at age 74. Hepatitis C blood test. Hepatitis B blood test. Sexually transmitted disease (STD) testing. Diabetes screening. This is done by checking your blood sugar (glucose) after you have not eaten for a while (fasting). You may have this done every 1-3 years. Bone density scan. This is done to screen for osteoporosis. You may have this done starting at age 74. Mammogram. This may be done every 1-2 years. Talk to your health care provider about how often you should have regular mammograms. Talk with your health care provider about your test results, treatment options, and if necessary, the need for more tests. Vaccines  Your health care provider may recommend certain vaccines, such as: Influenza vaccine. This is recommended every year. Tetanus,  diphtheria, and acellular pertussis (Tdap, Td) vaccine. You may need a Td booster every 10  years. Zoster vaccine. You may need this after age 74. Pneumococcal 13-valent conjugate (PCV13) vaccine. One dose is recommended after age 74. Pneumococcal polysaccharide (PPSV23) vaccine. One dose is recommended after age 74. Talk to your health care provider about which screenings and vaccines you need and how often you need them. This information is not intended to replace advice given to you by your health care provider. Make sure you discuss any questions you have with your health care provider. Document Released: 08/11/2015 Document Revised: 04/03/2016 Document Reviewed: 05/16/2015 Elsevier Interactive Patient Education  2017 [place] Prevention in the Home Falls can cause injuries. They can happen to people of all ages. There are many things you can do to make your home safe and to help prevent falls. What can I do on the outside of my home? Regularly fix the edges of walkways and driveways and fix any cracks. Remove anything that might make you trip as you walk through a door, such as a raised step or threshold. Trim any bushes or trees on the path to your home. Use bright outdoor lighting. Clear any walking paths of anything that might make someone trip, such as rocks or tools. Regularly check to see if handrails are loose or broken. Make sure that both sides of any steps have handrails. Any raised decks and porches should have guardrails on the edges. Have any leaves, snow, or ice cleared regularly. Use sand or salt on walking paths during winter. Clean up any spills in your garage right away. This includes oil or grease spills. What can I do in the bathroom? Use night lights. Install grab bars by the toilet and in the tub and shower. Do not use towel bars as grab bars. Use non-skid mats or decals in the tub or shower. If you need to sit down in the shower, use a plastic, non-slip stool. Keep the floor dry. Clean up any water that spills on the floor as soon as it  happens. Remove soap buildup in the tub or shower regularly. Attach bath mats securely with double-sided non-slip rug tape. Do not have throw rugs and other things on the floor that can make you trip. What can I do in the bedroom? Use night lights. Make sure that you have a light by your bed that is easy to reach. Do not use any sheets or blankets that are too big for your bed. They should not hang down onto the floor. Have a firm chair that has side arms. You can use this for support while you get dressed. Do not have throw rugs and other things on the floor that can make you trip. What can I do in the kitchen? Clean up any spills right away. Avoid walking on wet floors. Keep items that you use a lot in easy-to-reach places. If you need to reach something above you, use a strong step stool that has a grab bar. Keep electrical cords out of the way. Do not use floor polish or wax that makes floors slippery. If you must use wax, use non-skid floor wax. Do not have throw rugs and other things on the floor that can make you trip. What can I do with my stairs? Do not leave any items on the stairs. Make sure that there are handrails on both sides of the stairs and use them. Fix handrails that are broken or loose. Make  sure that handrails are as long as the stairways. Check any carpeting to make sure that it is firmly attached to the stairs. Fix any carpet that is loose or worn. Avoid having throw rugs at the top or bottom of the stairs. If you do have throw rugs, attach them to the floor with carpet tape. Make sure that you have a light switch at the top of the stairs and the bottom of the stairs. If you do not have them, ask someone to add them for you. What else can I do to help prevent falls? Wear shoes that: Do not have high heels. Have rubber bottoms. Are comfortable and fit you well. Are closed at the toe. Do not wear sandals. If you use a stepladder: Make sure that it is fully opened.  Do not climb a closed stepladder. Make sure that both sides of the stepladder are locked into place. Ask someone to hold it for you, if possible. Clearly mark and make sure that you can see: Any grab bars or handrails. First and last steps. Where the edge of each step is. Use tools that help you move around (mobility aids) if they are needed. These include: Canes. Walkers. Scooters. Crutches. Turn on the lights when you go into a dark area. Replace any light bulbs as soon as they burn out. Set up your furniture so you have a clear path. Avoid moving your furniture around. If any of your floors are uneven, fix them. If there are any pets around you, be aware of where they are. Review your medicines with your doctor. Some medicines can make you feel dizzy. This can increase your chance of falling. Ask your doctor what other things that you can do to help prevent falls. This information is not intended to replace advice given to you by your health care provider. Make sure you discuss any questions you have with your health care provider. Document Released: 05/11/2009 Document Revised: 12/21/2015 Document Reviewed: 08/19/2014 Elsevier Interactive Patient Education  2017 [employer].

## 2021-10-02 ENCOUNTER — Encounter: Payer: Self-pay | Admitting: Internal Medicine

## 2021-10-02 ENCOUNTER — Ambulatory Visit (INDEPENDENT_AMBULATORY_CARE_PROVIDER_SITE_OTHER): Payer: PPO | Admitting: Internal Medicine

## 2021-10-02 ENCOUNTER — Other Ambulatory Visit: Payer: Self-pay

## 2021-10-02 VITALS — BP 108/62 | HR 93 | Temp 98.1°F | Ht 60.0 in | Wt 116.0 lb

## 2021-10-02 DIAGNOSIS — B009 Herpesviral infection, unspecified: Secondary | ICD-10-CM

## 2021-10-02 MED ORDER — VALACYCLOVIR HCL 1 G PO TABS: 1000.0000 mg | ORAL_TABLET | Freq: Two times a day (BID) | ORAL | 0 refills | Status: DC

## 2021-10-02 NOTE — Progress Notes (Signed)
Chief Complaint  ?Patient presents with  ? Rash  ?  Vaginal rash noticed late Friday-early Saturday. Patient states started with one inflamed red spot that has spread. No discharge or itching. States the rash burns, rated this a 5/10.   ? ?F/u  ?1. Vaginal rash x Saturday started on left burning no new products tried ky jelly but rash is spreading no discharge started as red spot 5/10 burning pain  ? ? ?Review of Systems  ?Constitutional:  Negative for weight loss.  ?HENT:  Negative for hearing loss.   ?Eyes:  Negative for blurred vision.  ?Respiratory:  Negative for shortness of breath.   ?Cardiovascular:  Negative for chest pain.  ?Gastrointestinal:  Negative for abdominal pain and blood in stool.  ?Genitourinary:  Negative for dysuria.  ?Musculoskeletal:  Negative for falls and joint pain.  ?Skin:  Positive for rash.  ?Neurological:  Negative for headaches.  ?Psychiatric/Behavioral:  Negative for depression.   ?Past Medical History:  ?Diagnosis Date  ? Arthritis   ? Cataract 2019  ? resolved with surgery by Dr. George Ina  ? Chicken pox   ? Osteoporosis   ? Vertigo   ? ?Past Surgical History:  ?Procedure Laterality Date  ? BREAST BIOPSY Left 09/29/2020  ? [place] Bx, Q clip, path pending   ? CATARACT EXTRACTION W/PHACO Left 03/18/2017  ? Procedure: CATARACT EXTRACTION PHACO AND INTRAOCULAR LENS PLACEMENT (IOC);  Surgeon: Birder Robson, MD;  Location: [facility] ORS;  Service: Ophthalmology;  Laterality: Left;  [place] 01:23.9 ?AP% 20.9 ?CDE 17.58 ?Fluid Pack lot # [id number]  ? CATARACT EXTRACTION W/PHACO Right 11/25/2017  ? Procedure: CATARACT EXTRACTION PHACO AND INTRAOCULAR LENS PLACEMENT (IOC);  Surgeon: Birder Robson, MD;  Location: [facility] OR[facility];  Service: Ophthalmology;  Laterality: Right;  [place] 00:41.5 ?AP% 16.7 ?CDE 6.95 ?Fluid Pack Lot # [id number]  ? EYE SURGERY Left 03/2016  ? retina  ? ?Family History  ?Problem Relation Age of Onset  ? Arthritis Mother   ? ?Social History  ? ?Socioeconomic History  ? Marital status:  Married  ?  Spouse name: Not on file  ? Number of children: Not on file  ? Years of education: Not on file  ? Highest education level: Not on file  ?Occupational History  ? Not on file  ?Tobacco Use  ? Smoking status: Never  ? Smokeless tobacco: Never  ?Vaping Use  ? Vaping Use: Never used  ?Substance and Sexual Activity  ? Alcohol use: Yes  ?  Alcohol/week: 0.0 standard drinks  ?  Comment: social  ? Drug use: No  ? Sexual activity: Yes  ?Other Topics Concern  ? Not on file  ?Social History Narrative  ? Married.  ? 2 sons. 3 grandchildren.  ? Retired. Worked as a Water quality scientist.   ? Enjoys walking, decorating, reading, sewing.  ? ?Social Determinants of Health  ? ?Financial Resource Strain: Low Risk   ? Difficulty of Paying Living Expenses: Not hard at all  ?Food Insecurity: No Food Insecurity  ? Worried About Charity fundraiser in the Last Year: Never true  ? Ran Out of Food in the Last Year: Never true  ?Transportation Needs: No Transportation Needs  ? Lack of Transportation (Medical): No  ? Lack of Transportation (Non-Medical): No  ?Physical Activity: Sufficiently Active  ? Days of Exercise per Week: 5 days  ? Minutes of Exercise per Session: 40 min  ?Stress: No Stress Concern Present  ? Feeling of Stress : Not at all  ?Social  Connections: Moderately Integrated  ? Frequency of Communication with Friends and Family: Three times a week  ? Frequency of Social Gatherings with Friends and Family: More than three times a week  ? Attends Religious Services: Never  ? Active Member of Clubs or Organizations: Yes  ? Attends Archivist Meetings: More than 4 times per year  ? Marital Status: Married  ?Intimate Partner Violence: Not At Risk  ? Fear of Current or Ex-Partner: No  ? Emotionally Abused: No  ? Physically Abused: No  ? Sexually Abused: No  ? ?Current Meds  ?Medication Sig  ? acetaminophen (TYLENOL) 325 MG tablet Take 2 tablets (650 mg total) by mouth every 4 (four) hours as needed for headache or mild  pain.  ? Calcium Carbonate-Vitamin D (CALCIUM-VITAMIN D3 PO) Take 1 tablet by mouth daily.  ? estradiol (ESTRACE) 0.1 MG/GM vaginal cream apply 2-3 times weekly.  ? lactase (LACTAID) 3000 units tablet Take 3,000 Units by mouth daily as needed (for lactose intolerant).   ? loratadine (CLARITIN) 10 MG tablet Take 10 mg by mouth daily as needed for allergies.  ? naproxen sodium (ALEVE) 220 MG tablet Take 220 mg by mouth daily as needed (for pain or headache).  ? OVER THE COUNTER MEDICATION Eye drops as needed.  ? valACYclovir (VALTREX) 1000 MG tablet Take 1 tablet (1,000 mg total) by mouth 2 (two) times daily. X 7-10 days and as needed for outbreak  ? [DISCONTINUED] MODERNA COVID-19 BIVAL BOOSTER 50 MCG/0.5ML injection   ? ?Allergies  ?Allergen Reactions  ? Sulfa Antibiotics Other (See Comments)  ?  Childhood allergy  ? ?No results found for this or any previous visit (from the past 2160 hour(s)). ?Objective  ?Body mass index is 22.65 kg/m?. ?Wt Readings from Last 3 Encounters:  ?10/02/21 116 lb (52.6 kg)  ?08/29/21 113 lb (51.3 kg)  ?05/29/21 111 lb 5 oz (50.5 kg)  ? ?Temp Readings from Last 3 Encounters:  ?10/02/21 98.1 ?F (36.7 ?C) (Oral)  ?05/29/21 (!) 97.4 ?F (36.3 ?C) (Temporal)  ?05/24/20 (!) 97.5 ?F (36.4 ?C) (Temporal)  ? ?BP Readings from Last 3 Encounters:  ?10/02/21 108/62  ?05/29/21 100/62  ?05/24/20 (!) 102/58  ? ?Pulse Readings from Last 3 Encounters:  ?10/02/21 93  ?05/29/21 78  ?05/24/20 74  ? ? ?Physical Exam ?Vitals and nursing note reviewed.  ?Constitutional:   ?   Appearance: Normal appearance. She is well-developed and well-groomed.  ?HENT:  ?   Head: Normocephalic and atraumatic.  ?Eyes:  ?   Conjunctiva/sclera: Conjunctivae normal.  ?   Pupils: Pupils are equal, round, and reactive to light.  ?Cardiovascular:  ?   Rate and Rhythm: Normal rate and regular rhythm.  ?   Heart sounds: Normal heart sounds. No murmur heard. ?Pulmonary:  ?   Effort: Pulmonary effort is normal.  ?   Breath sounds:  Normal breath sounds.  ?Abdominal:  ?   General: Abdomen is flat. Bowel sounds are normal.  ?   Tenderness: There is no abdominal tenderness.  ?Genitourinary: ?   Exam position: Supine.  ? ? ?Musculoskeletal:     ?   General: No tenderness.  ?Skin: ?   General: Skin is warm and dry.  ?Neurological:  ?   General: No focal deficit present.  ?   Mental Status: She is alert and oriented to person, place, and time. Mental status is at baseline.  ?   Cranial Nerves: Cranial nerves 2-12 are intact.  ?  Motor: Motor function is intact.  ?   Coordination: Coordination is intact.  ?   Gait: Gait is intact.  ?Psychiatric:     ?   Attention and Perception: Attention and perception normal.     ?   Mood and Affect: Mood and affect normal.     ?   Speech: Speech normal.     ?   Behavior: Behavior normal. Behavior is cooperative.     ?   Thought Content: Thought content normal.     ?   Cognition and Memory: Cognition and memory normal.     ?   Judgment: Judgment normal.  ? ? ?Assessment  ?Plan  ?Herpes vaginal area no h/o outbreak- Plan: Herpes simplex virus culture,  ?Valtrex 1000 mg bid x 7-10 days and prn  ? ? ?Provider: Dr. Olivia Mackie McLean-Scocuzza-[facility]  ?

## 2021-10-02 NOTE — Patient Instructions (Signed)
Valacyclovir Tablets What is this medication? VALACYCLOVIR (val ay SYE kloe veer) helps manage infections, such as cold sores, genital herpes, shingles, and chickenpox, caused by viruses. It will not treat colds, the flu, or infections caused by bacteria. This medicine may be used for other purposes; ask your health care provider or pharmacist if you have questions. COMMON BRAND NAME(S): Valtrex What should I tell my care team before I take this medication? They need to know if you have any of these conditions: Acquired immunodeficiency syndrome (AIDS) Any other condition that may weaken the immune system Bone marrow or kidney transplant Kidney disease An unusual or allergic reaction to valacyclovir, acyclovir, ganciclovir, valganciclovir, other medications, foods, dyes, or preservatives Pregnant or trying to get pregnant Breast-feeding How should I use this medication? Take this medication by mouth with a glass of water. Follow the directions on the prescription label. You can take this medication with or without food. Take your doses at regular intervals. Do not take your medication more often than directed. Finish the full course prescribed by your care team even if you think your condition is better. Do not stop taking except on the advice of your care team. Talk to your care team about the use of this medication in children. While this medication may be prescribed for children as young as 2 years for selected conditions, precautions do apply. Overdosage: If you think you have taken too much of this medicine contact a poison control center or emergency room at once. NOTE: This medicine is only for you. Do not share this medicine with others. What if I miss a dose? If you miss a dose, take it as soon as you can. If it is almost time for your next dose, take only that dose. Do not take double or extra doses. What may interact with this medication? Do not take this medication with any of the  following: Cidofovir This medication may also interact with the following: Adefovir Amphotericin B Certain antibiotics like amikacin, gentamicin, tobramycin, vancomycin Cimetidine Cisplatin Colistin Cyclosporine [place]et Lithium Methotrexate Probenecid Tacrolimus This list may not describe all possible interactions. Give your health care provider a list of all the medicines, herbs, non-prescription drugs, or dietary supplements you use. Also tell them if you smoke, drink alcohol, or use illegal drugs. Some items may interact with your medicine. What should I watch for while using this medication? Tell your care team if your symptoms do not start to get better after 1 week. This medication works best when taken early in the course of an infection, within the first 72 hours. Begin treatment as soon as possible after the first signs of infection like tingling, itching, or pain in the affected area. It is possible that genital herpes may still be spread even when you are not having symptoms. Always use safer sex practices like condoms made of latex or polyurethane whenever you have sexual contact. You should stay well hydrated while taking this medication. Drink plenty of fluids. What side effects may I notice from receiving this medication? Side effects that you should report to your care team as soon as possible: Allergic reactions--skin rash, itching, hives, swelling of the face, lips, tongue, or throat Confusion Hallucinations Kidney injury--decrease in the amount of urine, swelling of the ankles, hands, or feet Seizures Side effects that usually do not require medical attention (report to your care team if they continue or are bothersome): Headache Nausea Stomach pain This list may not describe all possible side effects. Call  your doctor for medical advice about side effects. You may report side effects to FDA at 1-800-FDA-1088. Where should I keep my medication? Keep out of the  reach of children. Store at room temperature between 15 and 25 degrees C (59 and 77 degrees F). Keep container tightly closed. Throw away any unused medication after the expiration date. NOTE: This sheet is a summary. It may not cover all possible information. If you have questions about this medicine, talk to your doctor, pharmacist, or health care provider.  2022 Elsevier/Gold Standard (2020-09-22 00:00:00)  Genital Herpes Genital herpes is a common sexually transmitted infection (STI) that is caused by a virus. The virus spreads from person to person through sexual contact. The infection can cause itching, blisters, and sores around the genitals or rectum. Symptoms may last for several days and then go away. This is called an outbreak. The virus remains in the body, however, so more outbreaks may happen in the future. The time between outbreaks varies and can be from months to years. Genital herpes can affect anyone. It is particularly concerning for pregnant women because the virus can be passed to the baby during delivery and can cause serious problems. Genital herpes is also a concern for people who have a weak disease-fighting system (immune system). What are the causes? This condition is caused by the human herpesvirus, also called herpes simplex virus, type 1 or type 2 (HSV-1 or HSV-2). The virus may spread through: Sexual contact with an infected person, including vaginal, anal, and oral sex. Contact with fluid from a herpes sore. The skin. This means that you can get herpes from an infected partner even if there are no blisters or sores present. Your partner may not know that he or she is infected. What increases the risk? You are more likely to develop this condition if: You have sex with many partners. You do not use latex condoms during sex. What are the signs or symptoms? Most people do not have symptoms (are asymptomatic), or they have mild symptoms that may be mistaken for other  skin problems. Symptoms may include: Small, red bumps near the genitals, rectum, or mouth. These bumps turn into blisters and then sores. Flu-like symptoms, including: Fever. Body aches. Swollen lymph nodes. Headache. Painful urination. Pain and itching in the genital area or rectal area. Vaginal discharge. Tingling or shooting pain in the legs and buttocks. Generally, symptoms are more severe and last longer during the first (primary) outbreak. Flu-like symptoms are also more common during the primary outbreak. How is this diagnosed? This condition may be diagnosed based on: A physical exam. Your medical history. Blood tests. A test of a fluid sample (culture) from an open sore. How is this treated? There is no cure for this condition, but treatment with antiviral medicines that are taken by mouth (orally) can do the following: Speed up healing and relieve symptoms. Help to reduce the spread of the virus to sexual partners. Limit the chance of future outbreaks, or make future outbreaks shorter. Lessen symptoms of future outbreaks. Your health care provider may also recommend pain relief medicines, such as aspirin or ibuprofen. Follow these instructions at home: If you have an outbreak: Keep the affected areas dry and clean. Avoid rubbing or touching blisters and sores. If you do touch blisters or sores: Wash your hands thoroughly with soap and water. Do not touch your eyes afterward. To help relieve pain or itching, you may take the following actions as told by your health  care provider: Apply a cold, wet cloth (cold compress) to affected areas 4-6 times a day. Apply a substance that protects your skin and reduces bleeding (astringent). Apply a gel that helps relieve pain around sores (lidocaine gel). Take a warm, shallow bath that cleans the genital area (sitz bath). Sexual activity Do not have sexual contact during active outbreaks. Practice safe sex. Herpes can spread even  if your partner does not have blisters or sores. Latex condoms and female condoms may help prevent the spread of the herpes virus. General instructions Take over-the-counter and prescription medicines only as told by your health care provider. Keep all follow-up visits as told by your health care provider. This is important. How is this prevented? Use condoms. Although you can get genital herpes during sexual contact even with the use of a condom, a condom can provide some protection. Avoid having multiple sexual partners. Talk with your sexual partner about any symptoms either of you may have. Also, talk with your partner about any history of STIs. Get tested for STIs before you have sex. Ask your partner to do the same. Do not have sexual contact if you have active symptoms of genital herpes. Contact a health care provider if: Your symptoms are not improving with medicine. Your symptoms return, or you have new symptoms. You have a fever. You have abdominal pain. You have redness, swelling, or pain in your eye. You notice new sores on other parts of your body. You are a woman and you experience bleeding between menstrual periods. You have had herpes and you become pregnant or plan to become pregnant. Summary Genital herpes is a common sexually transmitted infection (STI) that is caused by the herpes simplex virus, type 1 or type 2 (HSV-1 or HSV-2). These viruses are most often spread through sexual contact with an infected person. You are more likely to develop this condition if you have sex with many partners or you do not use condoms during sex. Most people do not have symptoms (are asymptomatic) or have mild symptoms that may be mistaken for other skin problems. Symptoms occur as outbreaks that may happen months or years apart. There is no cure for this condition, but treatment with oral antiviral medicines can reduce symptoms, reduce the chance of spreading the virus to a partner,  prevent future outbreaks, or shorten future outbreaks. This information is not intended to replace advice given to you by your health care provider. Make sure you discuss any questions you have with your health care provider. Document Revised: 03/26/2021 Document Reviewed: 03/25/2019 Elsevier Patient Education  [place].

## 2021-10-04 LAB — HERPES SIMPLEX VIRUS CULTURE
MICRO NUMBER:: 13098246
SPECIMEN QUALITY:: ADEQUATE

## 2021-11-12 ENCOUNTER — Ambulatory Visit
Admission: RE | Admit: 2021-11-12 | Discharge: 2021-11-12 | Disposition: A | Discharge status: Home / Self Care | Payer: PPO | Source: Ambulatory Visit | Attending: Primary Care | Admitting: Primary Care

## 2021-11-12 DIAGNOSIS — Z1231 Encounter for screening mammogram for malignant neoplasm of breast: Secondary | ICD-10-CM | POA: Insufficient documentation

## 2022-01-08 ENCOUNTER — Ambulatory Visit: Payer: PPO | Admitting: Dermatology

## 2022-05-02 ENCOUNTER — Ambulatory Visit (INDEPENDENT_AMBULATORY_CARE_PROVIDER_SITE_OTHER): Payer: PPO

## 2022-05-02 DIAGNOSIS — Z23 Encounter for immunization: Secondary | ICD-10-CM | POA: Diagnosis not present

## 2022-07-17 DIAGNOSIS — H26492 Other secondary cataract, left eye: Secondary | ICD-10-CM | POA: Diagnosis not present

## 2022-07-17 DIAGNOSIS — H35372 Puckering of macula, left eye: Secondary | ICD-10-CM | POA: Diagnosis not present

## 2022-07-29 HISTORY — PX: TOOTH EXTRACTION: SUR596

## 2022-07-30 ENCOUNTER — Ambulatory Visit: Payer: PPO | Admitting: Dermatology

## 2022-07-30 ENCOUNTER — Encounter: Payer: Self-pay | Admitting: Dermatology

## 2022-07-30 VITALS — BP 96/64 | HR 79

## 2022-07-30 DIAGNOSIS — L578 Other skin changes due to chronic exposure to nonionizing radiation: Secondary | ICD-10-CM | POA: Diagnosis not present

## 2022-07-30 DIAGNOSIS — L814 Other melanin hyperpigmentation: Secondary | ICD-10-CM

## 2022-07-30 DIAGNOSIS — Z1283 Encounter for screening for malignant neoplasm of skin: Secondary | ICD-10-CM | POA: Diagnosis not present

## 2022-07-30 DIAGNOSIS — D2261 Melanocytic nevi of right upper limb, including shoulder: Secondary | ICD-10-CM | POA: Diagnosis not present

## 2022-07-30 DIAGNOSIS — L570 Actinic keratosis: Secondary | ICD-10-CM | POA: Diagnosis not present

## 2022-07-30 DIAGNOSIS — L821 Other seborrheic keratosis: Secondary | ICD-10-CM | POA: Diagnosis not present

## 2022-07-30 DIAGNOSIS — D2262 Melanocytic nevi of left upper limb, including shoulder: Secondary | ICD-10-CM

## 2022-07-30 DIAGNOSIS — D229 Melanocytic nevi, unspecified: Secondary | ICD-10-CM | POA: Diagnosis not present

## 2022-07-30 DIAGNOSIS — D225 Melanocytic nevi of trunk: Secondary | ICD-10-CM | POA: Diagnosis not present

## 2022-07-30 NOTE — Progress Notes (Signed)
Follow-Up Visit   Subjective  Jackie Oneal is a 76 y.o. female who presents for the following: Annual Exam (Hx of Aks. 5FU/Calcipotriene prescribed for forehead December 2022, has not used).  The patient presents for Total-Body Skin Exam (TBSE) for skin cancer screening and mole check.  The patient has spots, moles and lesions to be evaluated, some may be new or changing and the patient has concerns that these could be cancer.   The following portions of the chart were reviewed this encounter and updated as appropriate:      Review of Systems: No other skin or systemic complaints except as noted in HPI or Assessment and Plan.   Objective  Well appearing patient in no apparent distress; mood and affect are within normal limits.  A full examination was performed including scalp, head, eyes, ears, nose, lips, neck, chest, axillae, abdomen, back, buttocks, bilateral upper extremities, bilateral lower extremities, hands, feet, fingers, toes, fingernails, and toenails. All findings within normal limits unless otherwise noted below.  left lower cheek x2, forehead x3, right index finger x,1 right hand dorsum x1 (7) Erythematous thin papules/macules with gritty scale.   Left Breast, shoulders Flesh papules   Assessment & Plan   Lentigines - Scattered tan macules - Due to sun exposure - Benign-appearing, observe - Recommend daily broad spectrum sunscreen SPF 30+ to sun-exposed areas, reapply every 2 hours as needed. - Call for any changes  Seborrheic Keratoses. Left abdomen.  - Stuck-on, waxy, tan-brown papules and/or plaques  - Benign-appearing - Discussed benign etiology and prognosis. - Observe - Call for any changes  Melanocytic Nevi - Tan-brown and/or pink-flesh-colored symmetric macules and papules - Benign appearing on exam today - Observation - Call clinic for new or changing moles - Recommend daily use of broad spectrum spf 30+ sunscreen to sun-exposed areas.    Hemangiomas. Torso. - Red papules - Discussed benign nature - Observe - Call for any changes  Actinic Damage with PreCancerous Actinic Keratoses Counseling for Topical Chemotherapy Management: Patient exhibits: - Severe, confluent actinic changes with pre-cancerous actinic keratoses that is secondary to cumulative UV radiation exposure over time - Condition that is severe; chronic, not at goal. - diffuse scaly erythematous macules and papules with underlying dyspigmentation - Discussed Prescription "Field Treatment" topical Chemotherapy for Severe, Chronic Confluent Actinic Changes with Pre-Cancerous Actinic Keratoses Field treatment involves treatment of an entire area of skin that has confluent Actinic Changes (Sun/ Ultraviolet light damage) and PreCancerous Actinic Keratoses by method of PhotoDynamic Therapy (PDT) and/or prescription Topical Chemotherapy agents such as 5-fluorouracil, 5-fluorouracil/calcipotriene, and/or imiquimod.  The purpose is to decrease the number of clinically evident and subclinical PreCancerous lesions to prevent progression to development of skin cancer by chemically destroying early precancer changes that may or may not be visible.  It has been shown to reduce the risk of developing skin cancer in the treated area. As a result of treatment, redness, scaling, crusting, and open sores may occur during treatment course. One or more than one of these methods may be used and may have to be used several times to control, suppress and eliminate the PreCancerous changes. Discussed treatment course, expected reaction, and possible side effects. - Recommend daily broad spectrum sunscreen SPF 30+ to sun-exposed areas, reapply every 2 hours as needed.  - Staying in the shade or wearing long sleeves, sun glasses (UVA+UVB protection) and wide brim hats (4-inch brim around the entire circumference of the hat) are also recommended. - Call for new or changing  lesions. - Start  5-fluorouracil/calcipotriene cream twice a day for 5-7 days to affected areas including face. Prescription sent to [facility]. Patient advised they will receive an email to purchase the medication online and have it sent to their home. Patient provided with handout reviewing treatment course and side effects and advised to call or message [place] on MyChart with any concerns.  Reviewed course of treatment and expected reaction.  Patient advised to expect inflammation and crusting and advised that erosions are possible.  Patient advised to be diligent with sun protection during and after treatment. Counseled to keep medication out of reach of children and pets.   Skin cancer screening performed today.  AK (actinic keratosis) (7) left lower cheek x2, forehead x3, right index finger x,1 right hand dorsum x1  Actinic keratoses are precancerous spots that appear secondary to cumulative UV radiation exposure/sun exposure over time. They are chronic with expected duration over 1 year. A portion of actinic keratoses will progress to squamous cell carcinoma of the skin. It is not possible to reliably predict which spots will progress to skin cancer and so treatment is recommended to prevent development of skin cancer.  Recommend daily broad spectrum sunscreen SPF 30+ to sun-exposed areas, reapply every 2 hours as needed.  Recommend staying in the shade or wearing long sleeves, sun glasses (UVA+UVB protection) and wide brim hats (4-inch brim around the entire circumference of the hat). Call for new or changing lesions.  Destruction of lesion - left lower cheek x2, forehead x3, right index finger x,1 right hand dorsum x1  Destruction method: cryotherapy   Informed consent: discussed and consent obtained   Lesion destroyed using liquid nitrogen: Yes   Region frozen until ice ball extended beyond lesion: Yes   Outcome: patient tolerated procedure well with no complications    Post-procedure details: wound care instructions given   Additional details:  Prior to procedure, discussed risks of blister formation, small wound, skin dyspigmentation, or rare scar following cryotherapy. Recommend Vaseline ointment to treated areas while healing.   Nevus Left Breast, shoulders  Benign-appearing.  Observation.  Call clinic for new or changing lesions.  Recommend daily use of broad spectrum spf 30+ sunscreen to sun-exposed areas.     Return in about 6 months (around 01/28/2023) for AK Follow Up. TBSE in 1 year.  I, Emelia Salisbury, CMA, am acting as scribe for Brendolyn Patty, MD.  Documentation: I have reviewed the above documentation for accuracy and completeness, and I agree with the above.  Brendolyn Patty MD

## 2022-07-30 NOTE — Patient Instructions (Addendum)
Cryotherapy Aftercare  Wash gently with soap and water everyday.   Apply Vaseline and Band-Aid daily until healed.    - Start 5-fluorouracil/calcipotriene cream twice a day for 5-7 days to affected areas including face. Prescription sent to [facility]. Patient advised they will receive an email to purchase the medication online and have it sent to their home. Patient provided with handout reviewing treatment course and side effects and advised to call or message [place] on MyChart with any concerns.  Reviewed course of treatment and expected reaction.  Patient advised to expect inflammation and crusting and advised that erosions are possible.  Patient advised to be diligent with sun protection during and after treatment. Counseled to keep medication out of reach of children and pets.    Instructions for [facility]  One or more of your medications was sent to the [facility]. You will receive an email from them and can purchase the medicine through that link. It will then be mailed to your home at the address you confirmed. If for any reason you do not receive an email from them, please check your spam folder. If you still do not find the email, please let us know. [facility] phone number is [phone].     5-Fluorouracil/Calcipotriene Patient Education   Actinic keratoses are the dry, red scaly spots on the skin caused by sun damage. A portion of these spots can turn into skin cancer with time, and treating them can help prevent development of skin cancer.   Treatment of these spots requires removal of the defective skin cells. There are various ways to remove actinic keratoses, including freezing with liquid nitrogen, treatment with creams, or treatment with a blue light procedure in the office.   5-fluorouracil cream is a topical cream used to treat actinic keratoses. It works by interfering with the growth of  abnormal fast-growing skin cells, such as actinic keratoses. These cells peel off and are replaced by healthy ones.   5-fluorouracil/calcipotriene is a combination of the 5-fluorouracil cream with a vitamin D analog cream called calcipotriene. The calcipotriene alone does not treat actinic keratoses. However, when it is combined with 5-fluorouracil, it helps the 5-fluorouracil treat the actinic keratoses much faster so that the same results can be achieved with a much shorter treatment time.  INSTRUCTIONS FOR 5-FLUOROURACIL/CALCIPOTRIENE CREAM:   5-fluorouracil/calcipotriene cream typically only needs to be used for 4-7 days. A thin layer should be applied twice a day to the treatment areas recommended by your physician.   If your physician prescribed you separate tubes of 5-fluourouracil and calcipotriene, apply a thin layer of 5-fluorouracil followed by a thin layer of calcipotriene.   Avoid contact with your eyes, nostrils, and mouth. Do not use 5-fluorouracil/calcipotriene cream on infected or open wounds.   You will develop redness, irritation and some crusting at areas where you have pre-cancer damage/actinic keratoses. IF YOU DEVELOP PAIN, BLEEDING, OR SIGNIFICANT CRUSTING, STOP THE TREATMENT EARLY - you have already gotten a good response and the actinic keratoses should clear up well.  Wash your hands after applying 5-fluorouracil 5% cream on your skin.   A moisturizer or sunscreen with a minimum SPF 30 should be applied each morning.   Once you have finished the treatment, you can apply a thin layer of Vaseline twice a day to irritated areas to soothe and calm the areas more quickly. If you experience significant discomfort, contact your physician.  For some patients it is  necessary to repeat the treatment for best results.  SIDE EFFECTS: When using 5-fluorouracil/calcipotriene cream, you may have mild irritation, such as redness, dryness, swelling, or a mild burning sensation. This  usually resolves within 2 weeks. The more actinic keratoses you have, the more redness and inflammation you can expect during treatment. Eye irritation has been reported rarely. If this occurs, please let us know.  If you have any trouble using this cream, please call the office. If you have any other questions about this information, please do not hesitate to ask me before you leave the office.   Recommend daily broad spectrum sunscreen SPF 30+ to sun-exposed areas, reapply every 2 hours as needed. Call for new or changing lesions.  Staying in the shade or wearing long sleeves, sun glasses (UVA+UVB protection) and wide brim hats (4-inch brim around the entire circumference of the hat) are also recommended for sun protection.    Melanoma ABCDEs  Melanoma is the most dangerous type of skin cancer, and is the leading cause of death from skin disease.  You are more likely to develop melanoma if you: Have light-colored skin, light-colored eyes, or red or blond hair Spend a lot of time in the sun Tan regularly, either outdoors or in a tanning bed Have had blistering sunburns, especially during childhood Have a close family member who has had a melanoma Have atypical moles or large birthmarks  Early detection of melanoma is key since treatment is typically straightforward and cure rates are extremely high if we catch it early.   The first sign of melanoma is often a change in a mole or a new dark spot.  The ABCDE system is a way of remembering the signs of melanoma.  A for asymmetry:  The two halves do not match. B for border:  The edges of the growth are irregular. C for color:  A mixture of colors are present instead of an even brown color. D for diameter:  Melanomas are usually (but not always) greater than 34m - the size of a pencil eraser. E for evolution:  The spot keeps changing in size, shape, and color.  Please check your skin once per month between visits. You can use a small mirror  in front and a large mirror behind you to keep an eye on the back side or your body.   If you see any new or changing lesions before your next follow-up, please call to schedule a visit.  Please continue daily skin protection including broad spectrum sunscreen SPF 30+ to sun-exposed areas, reapplying every 2 hours as needed when you're outdoors.   Staying in the shade or wearing long sleeves, sun glasses (UVA+UVB protection) and wide brim hats (4-inch brim around the entire circumference of the hat) are also recommended for sun protection.    Due to recent changes in healthcare laws, you may see results of your pathology and/or laboratory studies on MyChart before the doctors have had a chance to review them. We understand that in some cases there may be results that are confusing or concerning to you. Please understand that not all results are received at the same time and often the doctors may need to interpret multiple results in order to provide you with the best plan of care or course of treatment. Therefore, we ask that you please give u[place]2 business days to thoroughly review all your results before contacting the office for clarification. Should we see a critical lab result, you will be contacted  sooner.   If You Need Anything After Your Visit  If you have any questions or concerns for your doctor, please call our main line at [phone] and press option 4 to reach your doctor's medical assistant. If no one answers, please leave a voicemail as directed and we will return your call as soon as possible. Messages left after 4 pm will be answered the following business day.   You may also send [place] a message via [place]. We typically respond to MyChart messages within 1-2 business days.  For prescription refills, please ask your pharmacy to contact our office. Our fax number is [phone].  If you have an urgent issue when the clinic is closed that cannot wait until the next business day, you  can page your doctor at the number below.    Please note that while we do our best to be available for urgent issues outside of office hours, we are not available 24/7.   If you have an urgent issue and are unable to reach [place], you may choose to seek medical care at your doctor's office, retail clinic, urgent care center, or emergency room.  If you have a medical emergency, please immediately call 911 or go to the emergency department.  Pager Numbers  - Dr. Nehemiah Massed: [phone]  - Dr. Laurence Ferrari: [phone]  - Dr. Nicole Kindred: [phone]  In the event of inclement weather, please call our main line at [phone] for an update on the status of any delays or closures.  Dermatology Medication Tips: Please keep the boxes that topical medications come in in order to help keep track of the instructions about where and how to use these. Pharmacies typically print the medication instructions only on the boxes and not directly on the medication tubes.   If your medication is too expensive, please contact our office at [phone] option 4 or send [place] a message through [place].   We are unable to tell what your co-pay for medications will be in advance as this is different depending on your insurance coverage. However, we may be able to find a substitute medication at lower cost or fill out paperwork to get insurance to cover a needed medication.   If a prior authorization is required to get your medication covered by your insurance company, please allow [place] 1-2 business days to complete this process.  Drug prices often vary depending on where the prescription is filled and some pharmacies may offer cheaper prices.  The website www.goodrx.com contains coupons for medications through different pharmacies. The prices here do not account for what the cost may be with help from insurance (it may be cheaper with your insurance), but the website can give you the price if you did not use any insurance.  -  You can print the associated coupon and take it with your prescription to the pharmacy.  - You may also stop by our office during regular business hours and pick up a GoodRx coupon card.  - If you need your prescription sent electronically to a different pharmacy, notify our office through [facility] or by phone at [phone] option 4.     Si Usted Necesita Algo Despus de Su Visita  Tambin puede enviarnos un mensaje a travs de Pharmacist, community. Por lo general respondemos a los mensajes de MyChart en el transcurso de 1 a 2 das hbiles.  Para renovar recetas, por favor pida a su farmacia que se ponga en contacto con nuestra oficina. Harland Dingwall de fax es el  [phone].  Si tiene un asunto urgente cuando la clnica est cerrada y que no puede esperar hasta el siguiente da hbil, puede llamar/localizar a su doctor(a) al nmero que aparece a continuacin.   Por favor, tenga en cuenta que aunque hacemos todo lo posible para estar disponibles para asuntos urgentes fuera del horario de [place], no estamos disponibles las 24 horas del da, los 7 das de la [place].   Si tiene un problema urgente y no puede comunicarse con nosotros, puede optar por buscar atencin mdica  en el consultorio de su doctor(a), en una clnica privada, en un centro de atencin urgente o en una sala de emergencias.  Si tiene Engineering geologist, por favor llame inmediatamente al 911 o vaya a la sala de emergencias.  Nmeros de bper  - Dr. Nehemiah Massed: [phone]  - Dra. Moye: [phone]  - Dra. Nicole Kindred: [phone]  En caso de inclemencias del [place], por favor llame a Johnsie Kindred principal al [phone] para una actualizacin sobre el [place] de cualquier retraso o cierre.  Consejos para la medicacin en dermatologa: Por favor, guarde las cajas en las que vienen los medicamentos de uso tpico para ayudarle a seguir las instrucciones sobre dnde y cmo usarlos. Las farmacias generalmente imprimen  las instrucciones del medicamento slo en las cajas y no directamente en los tubos del [place].   Si su medicamento es muy caro, por favor, pngase en contacto con Zigmund Daniel llamando al [phone] y presione la opcin 4 o envenos un mensaje a travs de Pharmacist, community.   No podemos decirle cul ser su copago por los medicamentos por adelantado ya que esto es diferente dependiendo de la cobertura de su seguro. Sin embargo, es posible que podamos encontrar un medicamento sustituto a Electrical engineer un formulario para que el seguro cubra el medicamento que se considera necesario.   Si se requiere una autorizacin previa para que su compaa de seguros [place] su medicamento, por favor permtanos de 1 a 2 das hbiles para completar este proceso.  Los precios de los medicamentos varan con frecuencia dependiendo del Environmental consultant de dnde se surte la receta y alguna farmacias pueden ofrecer precios ms baratos.  El sitio web www.goodrx.com tiene cupones para medicamentos de Airline pilot. Los precios aqu no tienen en cuenta lo que podra costar con la ayuda del seguro (puede ser ms barato con su seguro), pero el sitio web puede darle el precio si no utiliz Research scientist (physical sciences).  - Puede imprimir el cupn correspondiente y llevarlo con su receta a la farmacia.  - Tambin puede pasar por nuestra oficina durante el horario de atencin regular y Charity fundraiser una tarjeta de cupones de GoodRx.  - Si necesita que su receta se enve electrnicamente a una farmacia diferente, informe a nuestra oficina a travs de MyChart de [place] o por telfono llamando al [phone] y presione la opcin 4.

## 2022-08-30 ENCOUNTER — Ambulatory Visit (INDEPENDENT_AMBULATORY_CARE_PROVIDER_SITE_OTHER): Payer: PPO

## 2022-08-30 VITALS — Ht 60.0 in | Wt 114.0 lb

## 2022-08-30 DIAGNOSIS — Z0000 Encounter for general adult medical examination without abnormal findings: Secondary | ICD-10-CM

## 2022-08-30 NOTE — Progress Notes (Signed)
Subjective:   Jackie Oneal is a 77 y.o. female who presents for Medicare Annual (Subsequent) preventive examination.  Review of Systems    No ROS.  Medicare Wellness Virtual Visit.  Visual/audio telehealth visit, UTA vital signs.   See social history for additional risk factors.   Cardiac Risk Factors include: advanced age (>20mn, >>41women)     Objective:    Today's Vitals   08/30/22 0833  Weight: 114 lb (51.7 kg)  Height: 5' (1.524 m)   Body mass index is 22.26 kg/m.     08/30/2022    8:33 AM 08/29/2021   12:01 PM 12/28/2019    8:15 AM 01/14/2019    3:25 AM 05/01/2018   10:10 AM 03/19/2018   12:55 PM 11/25/2017    7:43 AM  Advanced Directives  Does Patient Have a Medical Advance Directive? Yes Yes Yes No Yes Yes Yes  Type of AParamedicof [place]boroLiving will [place] will [place]Living will  [place] Living will;Healthcare Power of Attorney Living will  Does patient want to make changes to medical advance directive? No - Patient declined Yes (MAU/Ambulatory/Procedural Areas - Information given)    No - Patient declined   Copy of HCheathamin Chart? No - copy requested  No - copy requested  No - copy requested No - copy requested   Would patient like information on creating a medical advance directive?    No - Patient declined       Current Medications (verified) Outpatient Encounter Medications as of 08/30/2022  Medication Sig   acetaminophen (TYLENOL) 325 MG tablet Take 2 tablets (650 mg total) by mouth every 4 (four) hours as needed for headache or mild pain.   Calcium Carbonate-Vitamin D (CALCIUM-VITAMIN D3 PO) Take 1 tablet by mouth daily.   estradiol (ESTRACE) 0.1 MG/GM vaginal cream apply 2-3 times weekly.   lactase (LACTAID) 3000 units tablet Take 3,000 Units by mouth daily as needed (for lactose intolerant).    loratadine (CLARITIN) 10 MG  tablet Take 10 mg by mouth daily as needed for allergies.   naproxen sodium (ALEVE) 220 MG tablet Take 220 mg by mouth daily as needed (for pain or headache).   OVER THE COUNTER MEDICATION Eye drops as needed.   valACYclovir (VALTREX) 1000 MG tablet Take 1 tablet (1,000 mg total) by mouth 2 (two) times daily. X 7-10 days and as needed for outbreak   No facility-administered encounter medications on file as of 08/30/2022.    Allergies (verified) Sulfa antibiotics   History: Past Medical History:  Diagnosis Date   Arthritis    Cataract 2019   resolved with surgery by Dr. PGeorge Ina  Chicken pox    Osteoporosis    Vertigo    Past Surgical History:  Procedure Laterality Date   BREAST BIOPSY Left 09/29/2020   [place], Q clip, neg   CATARACT EXTRACTION W/PHACO Left 03/18/2017   Procedure: CATARACT EXTRACTION PHACO AND INTRAOCULAR LENS PLACEMENT (ILiscomb;  Surgeon: PBirder Robson MD;  Location: [facility] ORS;  Service: Ophthalmology;  Laterality: Left;  [place]01:23.9 AP% 20.9 CDE 17.58 Fluid Pack lot # [id number]   CATARACT EXTRACTION W/PHACO Right 11/25/2017   Procedure: CATARACT EXTRACTION PHACO AND INTRAOCULAR LENS PLACEMENT (IOC);  Surgeon: PBirder Robson MD;  Location: [facility] ORS;  Service: Ophthalmology;  Laterality: Right;  [place]00:41.5 AP% 16.7 CDE 6.95 Fluid Pack Lot # [id number]   EYE SURGERY Left 03/2016  retina   Family History  Problem Relation Age of Onset   Arthritis Mother    Social History   Socioeconomic History   Marital status: Married    Spouse name: Not on file   Number of children: Not on file   Years of education: Not on file   Highest education level: Not on file  Occupational History   Not on file  Tobacco Use   Smoking status: Never   Smokeless tobacco: Never  Vaping Use   Vaping Use: Never used  Substance and Sexual Activity   Alcohol use: Yes    Alcohol/week: 0.0 standard drinks of alcohol    Comment: social   Drug use: No   Sexual activity: Yes   Other Topics Concern   Not on file  Social History Narrative   Married.   2 sons. 3 grandchildren.   Retired. Worked as a Water quality scientist.    Enjoys walking, decorating, reading, sewing.   Social Determinants of Health   Financial Resource Strain: Low Risk  (08/30/2022)   Overall Financial Resource Strain (CARDIA)    Difficulty of Paying Living Expenses: Not hard at all  Food Insecurity: No Food Insecurity (08/30/2022)   Hunger Vital Sign    Worried About Running Out of Food in the Last Year: Never true    Ran Out of Food in the Last Year: Never true  Transportation Needs: No Transportation Needs (08/30/2022)   PRAPARE - Hydrologist (Medical): No    Lack of Transportation (Non-Medical): No  Physical Activity: Sufficiently Active (08/30/2022)   Exercise Vital Sign    Days of Exercise per Week: 6 days    Minutes of Exercise per Session: 40 min  Stress: No Stress Concern Present (08/30/2022)   [place]    Feeling of Stress : Not at all  Social Connections: Unknown (08/30/2022)   Social Connection and Isolation Panel [NHANES]    Frequency of Communication with Friends and Family: Three times a week    Frequency of Social Gatherings with Friends and Family: Twice a week    Attends Religious Services: Not on Advertising copywriter or Organizations: Yes    Attends Music therapist: More than 4 times per year    Marital Status: Married    Tobacco Counseling Counseling given: Not Answered   Clinical Intake:  Pre-visit preparation completed: Yes           How often do you need to have someone help you when you read instructions, pamphlets, or other written materials from your doctor or pharmacy?: 1 - Never   Interpreter Needed?: No      Activities of Daily Living    08/30/2022    8:36 AM 08/23/2022   10:52 AM  In your present state of health, do you have any  difficulty performing the following activities:  Hearing? 0 0  Vision? 0 0  Difficulty concentrating or making decisions? 0 0  Walking or climbing stairs? 0 0  Dressing or bathing? 0 0  Doing errands, shopping? 0 0  Preparing Food and eating ?  N  Using the Toilet?  N  In the past six months, have you accidently leaked urine?  N  Do you have problems with loss of bowel control?  N  Managing your Medications?  N  Managing your Finances?  N  Housekeeping or managing your Housekeeping?  N  Patient Care Team: Pleas Koch, NP as PCP - General (Internal Medicine)  Indicate any recent Medical Services you may have received from other than [facility] providers in the past year (date may be approximate).     Assessment:   This is a routine wellness examination for Jackie Oneal.  I connected with  Winter Jocelyn on 08/30/22 by a audio enabled telemedicine application and verified that I am speaking with the correct person using two identifiers.  Patient Location: Home  Provider Location: Office/Clinic  I discussed the limitations of evaluation and management by telemedicine. The patient expressed understanding and agreed to proceed.   Hearing/Vision screen Hearing Screening - Comments:: Patient is able to hear conversational tones without difficulty.  No issues reported.   Vision Screening - Comments:: Last exam 07/2022, Dr. Georgette Dover, wears glasses Cataracts extracted, bilateral  Dietary issues and exercise activities discussed: Current Exercise Habits: Home exercise routine, Type of exercise: walking, Time (Minutes): 40, Frequency (Times/Week): 6, Weekly Exercise (Minutes/Week): 240, Intensity: Mild   Goals Addressed               This Visit's Progress     Patient Stated     Weight (lb) < 114 lb (51.7 kg) (pt-stated)   114 lb (51.7 kg)     I want to lose about 5lb       Depression Screen    08/30/2022    8:35 AM 08/29/2021   12:03 PM 05/29/2021    9:41 AM 05/24/2020     9:18 AM 12/28/2019    8:17 AM 05/01/2018   10:09 AM 04/08/2017    1:59 PM  PHQ 2/9 Scores  PHQ - 2 Score 0 0 0 0 0 0 0  PHQ- 9 Score    0 0 0 0    Fall Risk    08/23/2022   10:52 AM 08/29/2021   12:02 PM 05/29/2021    9:40 AM 05/24/2020    9:16 AM 12/28/2019    8:16 AM  Fall Risk   Falls in the past year? 0 0 0 0 0  Number falls in past yr: 0 0 0 0 0  Injury with Fall? 0 0  0 0  Risk for fall due to :  No Fall Risks No Fall Risks  No Fall Risks  Follow up  Falls prevention discussed   Falls evaluation completed;Falls prevention discussed    FALL RISK PREVENTION PERTAINING TO THE HOME: Home free of loose throw rugs in walkways, pet beds, electrical cords, etc? Yes  Adequate lighting in your home to reduce risk of falls? Yes   ASSISTIVE DEVICES UTILIZED TO PREVENT FALLS: Life alert? No  Use of a cane, walker or w/c? No  Grab bars in the bathroom? No  Shower chair or bench in shower? Yes  Elevated toilet seat or a handicapped toilet? No   TIMED UP AND GO: Was the test performed? No .   Cognitive Function:    12/28/2019    8:22 AM 05/01/2018    1:16 PM 04/08/2017    1:59 PM  MMSE - Mini Mental State Exam  Orientation to time '5 5 5  '$ Orientation to Place '5 5 5  '$ Registration '3 3 3  '$ Attention/ Calculation 5 0 0  Recall '3 3 3  '$ Language- name 2 objects  0 0  Language- repeat '1 1 1  '$ Language- follow 3 step command  3 3  Language- read & follow direction  0 0  Write a sentence  0 0  Copy design  0 0  Total score  20 20        Immunizations Immunization History  Administered Date(s) Administered   DTaP 09/14/2013   Fluad Quad(high Dose 65+) 05/04/2019, 05/03/2020, 05/25/2021, 05/02/2022   Influenza, High Dose Seasonal PF 04/18/2015   Influenza,inj,Quad PF,6+ Mos 05/21/2016, 04/08/2017, 05/01/2018   PFIZER(Purple Top)SARS-COV-2 Vaccination 09/04/2019, 09/29/2019, 05/02/2020, 12/02/2020   Pneumococcal Conjugate-13 12/26/2015   Pneumococcal Polysaccharide-23 11/14/2013   Tdap  09/14/2013   Zoster, Live 07/24/2016   Shingrix Completed?: No.    Education has been provided regarding the importance of this vaccine. Patient has been advised to call insurance company to determine out of pocket expense if they have not yet received this vaccine. Advised may also receive vaccine at local pharmacy or Health Dept. Verbalized acceptance and understanding.  Screening Tests Health Maintenance  Topic Date Due   COVID-19 Vaccine (5 - 2023-24 season) 09/15/2022 (Originally 03/29/2022)   Zoster Vaccines- Shingrix (1 of 2) 11/28/2022 (Originally 03/09/1965)   Medicare Annual Wellness (AWV)  08/31/2023   DTaP/Tdap/Td (3 - Td or Tdap) 09/15/2023   MAMMOGRAM  11/13/2023   COLONOSCOPY (Pts 45-32yr Insurance coverage will need to be confirmed)  01/09/2025   Pneumonia Vaccine 75 Years old  Completed   INFLUENZA VACCINE  Completed   DEXA SCAN  Completed   Hepatitis C Screening  Completed   HPV VACCINES  Aged Out   Health Maintenance There are no preventive care reminders to display for this patient.  Lung Cancer Screening: (Low Dose CT Chest recommended if Age 75-80years, 30 pack-year currently smoking OR have quit w/in 15years.) does not qualify.   Hepatitis C Screening: Completed 11/2015.  Vision Screening: Recommended annual ophthalmology exams for early detection of glaucoma and other disorders of the eye.  Dental Screening: Recommended annual dental exams for proper oral hygiene. Visits every 6 months.  Community Resource Referral / Chronic Care Management: CRR required this visit?  No   CCM required this visit?  No      Plan:     I have personally reviewed and noted the following in the patient's chart:   Medical and social history Use of alcohol, tobacco or illicit drugs  Current medications and supplements including opioid prescriptions. Patient is not currently taking opioid prescriptions. Functional ability and status Nutritional status Physical  activity Advanced directives List of other physicians Hospitalizations, surgeries, and ER visits in previous 12 months Vitals Screenings to include cognitive, depression, and falls Referrals and appointments  In addition, I have reviewed and discussed with patient certain preventive protocols, quality metrics, and best practice recommendations. A written personalized care plan for preventive services as well as general preventive health recommendations were provided to patient.     DLeta Jungling LPN   24/10/8183

## 2022-08-30 NOTE — Patient Instructions (Addendum)
Jackie Oneal , Thank you for taking time to come for your Medicare Wellness Visit. I appreciate your ongoing commitment to your health goals. Please review the following plan we discussed and let me know if I can assist you in the future.   These are the goals we discussed:  Goals Addressed               This Visit's Progress     Patient Stated     Weight (lb) < 114 lb (51.7 kg) (pt-stated)   114 lb (51.7 kg)     I want to lose about 5lb         This is a list of the screening recommended for you and due dates:  Health Maintenance  Topic Date Due   COVID-19 Vaccine (5 - 2023-24 season) 09/15/2022*   Zoster (Shingles) Vaccine (1 of 2) 11/28/2022*   Medicare Annual Wellness Visit  08/31/2023   DTaP/Tdap/Td vaccine (3 - Td or Tdap) 09/15/2023   Mammogram  11/13/2023   Colon Cancer Screening  01/09/2025   Pneumonia Vaccine  Completed   Flu Shot  Completed   DEXA scan (bone density measurement)  Completed   Hepatitis C Screening: USPSTF Recommendation to screen - Ages 74-79 yo.  Completed   HPV Vaccine  Aged Out  *Topic was postponed. The date shown is not the original due date.    Advanced directives: End of life planning; Advance aging; Advanced directives discussed.  Copy of current HCPOA/Living Will requested.    Conditions/risks identified: none new.  Next appointment: Follow up in one year for your annual wellness visit    Preventive Care 65 Years and Older, Female Preventive care refers to lifestyle choices and visits with your health care provider that can promote health and wellness. What does preventive care include? A yearly physical exam. This is also called an annual well check. Dental exams once or twice a year. Routine eye exams. Ask your health care provider how often you should have your eyes checked. Personal lifestyle choices, including: Daily care of your teeth and gums. Regular physical activity. Eating a healthy diet. Avoiding tobacco and drug  use. Limiting alcohol use. Practicing safe sex. Taking low-dose aspirin every day. Taking vitamin and mineral supplements as recommended by your health care provider. What happens during an annual well check? The services and screenings done by your health care provider during your annual well check will depend on your age, overall health, lifestyle risk factors, and family history of disease. Counseling  Your health care provider may ask you questions about your: Alcohol use. Tobacco use. Drug use. Emotional well-being. Home and relationship well-being. Sexual activity. Eating habits. History of falls. Memory and ability to understand (cognition). Work and work Statistician. Reproductive health. Screening  You may have the following tests or measurements: Height, weight, and BMI. Blood pressure. Lipid and cholesterol levels. These may be checked every 5 years, or more frequently if you are over 2 years old. Skin check. Lung cancer screening. You may have this screening every year starting at age 18 if you have a 30-pack-year history of smoking and currently smoke or have quit within the past 15 years. Fecal occult blood test (FOBT) of the stool. You may have this test every year starting at age 49. Flexible sigmoidoscopy or colonoscopy. You may have a sigmoidoscopy every 5 years or a colonoscopy every 10 years starting at age 38. Hepatitis C blood test. Hepatitis B blood test. Sexually transmitted disease (STD) testing.  Diabetes screening. This is done by checking your blood sugar (glucose) after you have not eaten for a while (fasting). You may have this done every 1-3 years. Bone density scan. This is done to screen for osteoporosis. You may have this done starting at age 24. Mammogram. This may be done every 1-2 years. Talk to your health care provider about how often you should have regular mammograms. Talk with your health care provider about your test results, treatment  options, and if necessary, the need for more tests. Vaccines  Your health care provider may recommend certain vaccines, such as: Influenza vaccine. This is recommended every year. Tetanus, diphtheria, and acellular pertussis (Tdap, Td) vaccine. You may need a Td booster every 10 years. Zoster vaccine. You may need this after age 79. Pneumococcal 13-valent conjugate (PCV13) vaccine. One dose is recommended after age 32. Pneumococcal polysaccharide (PPSV23) vaccine. One dose is recommended after age 25. Talk to your health care provider about which screenings and vaccines you need and how often you need them. This information is not intended to replace advice given to you by your health care provider. Make sure you discuss any questions you have with your health care provider. Document Released: 08/11/2015 Document Revised: 04/03/2016 Document Reviewed: 05/16/2015 Elsevier Interactive Patient Education  2017 [place] Prevention in the Home Falls can cause injuries. They can happen to people of all ages. There are many things you can do to make your home safe and to help prevent falls. What can I do on the outside of my home? Regularly fix the edges of walkways and driveways and fix any cracks. Remove anything that might make you trip as you walk through a door, such as a raised step or threshold. Trim any bushes or trees on the path to your home. Use bright outdoor lighting. Clear any walking paths of anything that might make someone trip, such as rocks or tools. Regularly check to see if handrails are loose or broken. Make sure that both sides of any steps have handrails. Any raised decks and porches should have guardrails on the edges. Have any leaves, snow, or ice cleared regularly. Use sand or salt on walking paths during winter. Clean up any spills in your garage right away. This includes oil or grease spills. What can I do in the bathroom? Use night lights. Install grab  bars by the toilet and in the tub and shower. Do not use towel bars as grab bars. Use non-skid mats or decals in the tub or shower. If you need to sit down in the shower, use a plastic, non-slip stool. Keep the floor dry. Clean up any water that spills on the floor as soon as it happens. Remove soap buildup in the tub or shower regularly. Attach bath mats securely with double-sided non-slip rug tape. Do not have throw rugs and other things on the floor that can make you trip. What can I do in the bedroom? Use night lights. Make sure that you have a light by your bed that is easy to reach. Do not use any sheets or blankets that are too big for your bed. They should not hang down onto the floor. Have a firm chair that has side arms. You can use this for support while you get dressed. Do not have throw rugs and other things on the floor that can make you trip. What can I do in the kitchen? Clean up any spills right away. Avoid walking on wet floors.  Keep items that you use a lot in easy-to-reach places. If you need to reach something above you, use a strong step stool that has a grab bar. Keep electrical cords out of the way. Do not use floor polish or wax that makes floors slippery. If you must use wax, use non-skid floor wax. Do not have throw rugs and other things on the floor that can make you trip. What can I do with my stairs? Do not leave any items on the stairs. Make sure that there are handrails on both sides of the stairs and use them. Fix handrails that are broken or loose. Make sure that handrails are as long as the stairways. Check any carpeting to make sure that it is firmly attached to the stairs. Fix any carpet that is loose or worn. Avoid having throw rugs at the top or bottom of the stairs. If you do have throw rugs, attach them to the floor with carpet tape. Make sure that you have a light switch at the top of the stairs and the bottom of the stairs. If you do not have them,  ask someone to add them for you. What else can I do to help prevent falls? Wear shoes that: Do not have high heels. Have rubber bottoms. Are comfortable and fit you well. Are closed at the toe. Do not wear sandals. If you use a stepladder: Make sure that it is fully opened. Do not climb a closed stepladder. Make sure that both sides of the stepladder are locked into place. Ask someone to hold it for you, if possible. Clearly mark and make sure that you can see: Any grab bars or handrails. First and last steps. Where the edge of each step is. Use tools that help you move around (mobility aids) if they are needed. These include: Canes. Walkers. Scooters. Crutches. Turn on the lights when you go into a dark area. Replace any light bulbs as soon as they burn out. Set up your furniture so you have a clear path. Avoid moving your furniture around. If any of your floors are uneven, fix them. If there are any pets around you, be aware of where they are. Review your medicines with your doctor. Some medicines can make you feel dizzy. This can increase your chance of falling. Ask your doctor what other things that you can do to help prevent falls. This information is not intended to replace advice given to you by your health care provider. Make sure you discuss any questions you have with your health care provider. Document Released: 05/11/2009 Document Revised: 12/21/2015 Document Reviewed: 08/19/2014 Elsevier Interactive Patient Education  2017 [employer].

## 2022-09-06 NOTE — Progress Notes (Signed)
Subjective:   Jackie Oneal is a 77 y.o. female who presents for Medicare Annual (Subsequent) preventive examination.  Second note for visit completed 08/30/22.   Patient Medicare AWV questionnaire was completed by the patient on 08/30/22, I have confirmed that all information answered by patient is correct and no changes since this date.      Objective:    Today's Vitals   08/30/22 0833  Weight: 114 lb (51.7 kg)  Height: 5' (1.524 m)   Body mass index is 22.26 kg/m.     08/30/2022    8:33 AM 08/29/2021   12:01 PM 12/28/2019    8:15 AM 01/14/2019    3:25 AM 05/01/2018   10:10 AM 03/19/2018   12:55 PM 11/25/2017    7:43 AM  Advanced Directives  Does Patient Have a Medical Advance Directive? Yes Yes Yes No Yes Yes Yes  Type of Paramedic of [place];Living will [place];Living will [place];Living will  [place];Living will Living will;Healthcare Power of Attorney Living will  Does patient want to make changes to medical advance directive? No - Patient declined Yes (MAU/Ambulatory/Procedural Areas - Information given)    No - Patient declined   Copy of [place] in Chart? No - copy requested  No - copy requested  No - copy requested No - copy requested   Would patient like information on creating a medical advance directive?    No - Patient declined       Current Medications (verified) Outpatient Encounter Medications as of 08/30/2022  Medication Sig   acetaminophen (TYLENOL) 325 MG tablet Take 2 tablets (650 mg total) by mouth every 4 (four) hours as needed for headache or mild pain.   Calcium Carbonate-Vitamin D (CALCIUM-VITAMIN D3 PO) Take 1 tablet by mouth daily.   estradiol (ESTRACE) 0.1 MG/GM vaginal cream apply 2-3 times weekly.   lactase (LACTAID) 3000 units tablet Take 3,000 Units by mouth daily as needed (for lactose intolerant).    loratadine (CLARITIN) 10 MG tablet Take 10  mg by mouth daily as needed for allergies.   naproxen sodium (ALEVE) 220 MG tablet Take 220 mg by mouth daily as needed (for pain or headache).   OVER THE COUNTER MEDICATION Eye drops as needed.   valACYclovir (VALTREX) 1000 MG tablet Take 1 tablet (1,000 mg total) by mouth 2 (two) times daily. X 7-10 days and as needed for outbreak   No facility-administered encounter medications on file as of 08/30/2022.    Allergies (verified) Sulfa antibiotics   History: Past Medical History:  Diagnosis Date   Arthritis    Cataract 2019   resolved with surgery by Dr. George Ina   Chicken pox    Osteoporosis    Vertigo    Past Surgical History:  Procedure Laterality Date   BREAST BIOPSY Left 09/29/2020   [place] Bx, Q clip, neg   CATARACT EXTRACTION W/PHACO Left 03/18/2017   Procedure: CATARACT EXTRACTION PHACO AND INTRAOCULAR LENS PLACEMENT ([place]);  Surgeon: Birder Robson, MD;  Location: [facility] ORS;  Service: Ophthalmology;  Laterality: Left;  [place] 01:23.9 AP% 20.9 CDE 17.58 Fluid Pack lot # [id number]   CATARACT EXTRACTION W/PHACO Right 11/25/2017   Procedure: CATARACT EXTRACTION PHACO AND INTRAOCULAR LENS PLACEMENT ([facility]);  Surgeon: Birder Robson, MD;  Location: [facility] ORS;  Service: Ophthalmology;  Laterality: Right;  [place] 00:41.5 AP% 16.7 CDE 6.95 Fluid Pack Lot # [id number]   EYE SURGERY Left 03/2016   retina  Family History  Problem Relation Age of Onset   Arthritis Mother    Social History   Socioeconomic History   Marital status: Married    Spouse name: Not on file   Number of children: Not on file   Years of education: Not on file   Highest education level: Not on file  Occupational History   Not on file  Tobacco Use   Smoking status: Never   Smokeless tobacco: Never  Vaping Use   Vaping Use: Never used  Substance and Sexual Activity   Alcohol use: Yes    Alcohol/week: 0.0 standard drinks of alcohol    Comment: social   Drug use: No   Sexual activity: Yes  Other Topics  Concern   Not on file  Social History Narrative   Married.   2 sons. 3 grandchildren.   Retired. Worked as a Water quality scientist.    Enjoys walking, decorating, reading, sewing.   Social Determinants of Health   Financial Resource Strain: Low Risk  (08/30/2022)   Overall Financial Resource Strain (CARDIA)    Difficulty of Paying Living Expenses: Not hard at all  Food Insecurity: No Food Insecurity (08/30/2022)   Hunger Vital Sign    Worried About Running Out of Food in the Last Year: Never true    Ran Out of Food in the Last Year: Never true  Transportation Needs: No Transportation Needs (08/30/2022)   PRAPARE - Hydrologist (Medical): No    Lack of Transportation (Non-Medical): No  Physical Activity: Sufficiently Active (08/30/2022)   Exercise Vital Sign    Days of Exercise per Week: 6 days    Minutes of Exercise per Session: 40 min  Stress: No Stress Concern Present (08/30/2022)   [place]    Feeling of Stress : Not at all  Social Connections: Unknown (08/30/2022)   Social Connection and Isolation Panel [NHANES]    Frequency of Communication with Friends and Family: Three times a week    Frequency of Social Gatherings with Friends and Family: Twice a week    Attends Religious Services: Not on Advertising copywriter or Organizations: Yes    Attends Music therapist: More than 4 times per year    Marital Status: Married    Tobacco Counseling Counseling given: Not Answered   Clinical Intake:  Pre-visit preparation completed: Yes           How often do you need to have someone help you when you read instructions, pamphlets, or other written materials from your doctor or pharmacy?: 1 - Never   Interpreter Needed?: No      Activities of Daily Living    08/30/2022    8:36 AM 08/23/2022   10:52 AM  In your present state of health, do you have any difficulty  performing the following activities:  Hearing? 0 0  Vision? 0 0  Difficulty concentrating or making decisions? 0 0  Walking or climbing stairs? 0 0  Dressing or bathing? 0 0  Doing errands, shopping? 0 0  Preparing Food and eating ?  N  Using the Toilet?  N  In the past six months, have you accidently leaked urine?  N  Do you have problems with loss of bowel control?  N  Managing your Medications?  N  Managing your Finances?  N  Housekeeping or managing your Housekeeping?  N    Patient  Care Team: Pleas Koch, NP as PCP - General (Internal Medicine)  Indicate any recent Medical Services you may have received from other than [facility] providers in the past year (date may be approximate).     Assessment:   This is a routine wellness examination for Nataly.  I connected with  Tanzie Laslie on 09/06/22 by a audio enabled telemedicine application and verified that I am speaking with the correct person using two identifiers.  Patient Location: Home  Provider Location: Office/Clinic  I discussed the limitations of evaluation and management by telemedicine. The patient expressed understanding and agreed to proceed.   Hearing/Vision screen Hearing Screening - Comments:: Patient is able to hear conversational tones without difficulty.  No issues reported.   Vision Screening - Comments:: Last exam 07/2022, Dr. Georgette Dover, wears glasses Cataracts extracted, bilateral  Dietary issues and exercise activities discussed: Current Exercise Habits: Home exercise routine, Type of exercise: walking, Time (Minutes): 40, Frequency (Times/Week): 6, Weekly Exercise (Minutes/Week): 240, Intensity: Mild   Goals Addressed               This Visit's Progress     Patient Stated     Weight (lb) < 114 lb (51.7 kg) (pt-stated)   114 lb (51.7 kg)     I want to lose about 5lb       Depression Screen    08/30/2022    8:35 AM 08/29/2021   12:03 PM 05/29/2021    9:41 AM 05/24/2020    9:18 AM  12/28/2019    8:17 AM 05/01/2018   10:09 AM 04/08/2017    1:59 PM  PHQ 2/9 Scores  PHQ - 2 Score 0 0 0 0 0 0 0  PHQ- 9 Score    0 0 0 0    Fall Risk    08/23/2022   10:52 AM 08/29/2021   12:02 PM 05/29/2021    9:40 AM 05/24/2020    9:16 AM 12/28/2019    8:16 AM  Fall Risk   Falls in the past year? 0 0 0 0 0  Number falls in past yr: 0 0 0 0 0  Injury with Fall? 0 0  0 0  Risk for fall due to :  No Fall Risks No Fall Risks  No Fall Risks  Follow up  Falls prevention discussed   Falls evaluation completed;Falls prevention discussed   Patient Medicare AWV questionnaire for falls was completed by the patient on 08/30/22, I have confirmed that all information answered by patient is correct and no changes since this date.   Cognitive Function: Patient is alert and oriented x3.  Manages her own finances and medications.  Denies difficulty with memory.  100% independent.  Correctly answered current month, year, time, counting backward from 20  to 1, months of the year in reverse and repeating of address phrase from earlier.      12/28/2019    8:22 AM 05/01/2018    1:16 PM 04/08/2017    1:59 PM  MMSE - Mini Mental State Exam  Orientation to time 5 5 5  $ Orientation to Place 5 5 5  $ Registration 3 3 3  $ Attention/ Calculation 5 0 0  Recall 3 3 3  $ Language- name 2 objects  0 0  Language- repeat 1 1 1  $ Language- follow 3 step command  3 3  Language- read & follow direction  0 0  Write a sentence  0 0  Copy design  0 0  Total score  20 20        Immunizations Immunization History  Administered Date(s) Administered   DTaP 09/14/2013   Fluad Quad(high Dose 65+) 05/04/2019, 05/03/2020, 05/25/2021, 05/02/2022   Influenza, High Dose Seasonal PF 04/18/2015   Influenza,inj,Quad PF,6+ Mos 05/21/2016, 04/08/2017, 05/01/2018   PFIZER(Purple Top)SARS-COV-2 Vaccination 09/04/2019, 09/29/2019, 05/02/2020, 12/02/2020   Pneumococcal Conjugate-13 12/26/2015   Pneumococcal Polysaccharide-23 11/14/2013    Tdap 09/14/2013   Zoster, Live 07/24/2016   Screening Tests Health Maintenance  Topic Date Due   COVID-19 Vaccine (5 - 2023-24 season) 09/15/2022 (Originally 03/29/2022)   Zoster Vaccines- Shingrix (1 of 2) 11/28/2022 (Originally 03/09/1965)   Medicare Annual Wellness (AWV)  08/31/2023   DTaP/Tdap/Td (3 - Td or Tdap) 09/15/2023   MAMMOGRAM  11/13/2023   COLONOSCOPY (Pts 45-54yr Insurance coverage will need to be confirmed)  01/09/2025   Pneumonia Vaccine 75 Years old  Completed   INFLUENZA VACCINE  Completed   DEXA SCAN  Completed   Hepatitis C Screening  Completed   HPV VACCINES  Aged Out       Gwenda Heiner MPort St. Joe LPN

## 2022-11-28 ENCOUNTER — Encounter: Payer: Self-pay | Admitting: Primary Care

## 2022-11-28 ENCOUNTER — Ambulatory Visit (INDEPENDENT_AMBULATORY_CARE_PROVIDER_SITE_OTHER): Payer: PPO | Admitting: Primary Care

## 2022-11-28 VITALS — BP 102/58 | HR 73 | Temp 97.6°F | Ht 60.5 in | Wt 115.0 lb

## 2022-11-28 DIAGNOSIS — E785 Hyperlipidemia, unspecified: Secondary | ICD-10-CM | POA: Diagnosis not present

## 2022-11-28 DIAGNOSIS — E559 Vitamin D deficiency, unspecified: Secondary | ICD-10-CM | POA: Diagnosis not present

## 2022-11-28 DIAGNOSIS — M810 Age-related osteoporosis without current pathological fracture: Secondary | ICD-10-CM | POA: Diagnosis not present

## 2022-11-28 DIAGNOSIS — Z1231 Encounter for screening mammogram for malignant neoplasm of breast: Secondary | ICD-10-CM | POA: Diagnosis not present

## 2022-11-28 DIAGNOSIS — H8110 Benign paroxysmal vertigo, unspecified ear: Secondary | ICD-10-CM

## 2022-11-28 DIAGNOSIS — Z0000 Encounter for general adult medical examination without abnormal findings: Secondary | ICD-10-CM | POA: Diagnosis not present

## 2022-11-28 DIAGNOSIS — E2839 Other primary ovarian failure: Secondary | ICD-10-CM | POA: Diagnosis not present

## 2022-11-28 LAB — COMPREHENSIVE METABOLIC PANEL
ALT: 10 U/L (ref 0–35)
AST: 17 U/L (ref 0–37)
Albumin: 4.2 g/dL (ref 3.5–5.2)
Alkaline Phosphatase: 52 U/L (ref 39–117)
BUN: 17 mg/dL (ref 6–23)
CO2: 28 mEq/L (ref 19–32)
Calcium: 9.7 mg/dL (ref 8.4–10.5)
Chloride: 100 mEq/L (ref 96–112)
Creatinine, Ser: 0.77 mg/dL (ref 0.40–1.20)
GFR: 75.83 mL/min (ref >60.00)
Glucose, Bld: 81 mg/dL (ref 70–99)
Potassium: 3.9 mEq/L (ref 3.5–5.1)
Sodium: 136 mEq/L (ref 135–145)
Total Bilirubin: 0.6 mg/dL (ref 0.2–1.2)
Total Protein: 6.8 g/dL (ref 6.0–8.3)

## 2022-11-28 LAB — CBC
HCT: 35.6 % — ABNORMAL LOW (ref 36.0–46.0)
Hemoglobin: 12 g/dL (ref 12.0–15.0)
MCHC: 33.6 g/dL (ref 30.0–36.0)
MCV: 92.8 fl (ref 78.0–100.0)
Platelets: 218 10*3/uL (ref 150.0–400.0)
RBC: 3.84 Mil/uL — ABNORMAL LOW (ref 3.87–5.11)
RDW: 12.9 % (ref 11.5–15.5)
WBC: 7.3 10*3/uL (ref 4.0–10.5)

## 2022-11-28 LAB — LIPID PANEL
Cholesterol: 200 mg/dL (ref 0–200)
HDL: 54.6 mg/dL (ref >39.00)
LDL Cholesterol: 130 mg/dL — ABNORMAL HIGH (ref 0–99)
NonHDL: 145.74
Total CHOL/HDL Ratio: 4
Triglycerides: 78 mg/dL (ref 0.0–149.0)
VLDL: 15.6 mg/dL (ref 0.0–40.0)

## 2022-11-28 LAB — VITAMIN D 25 HYDROXY (VIT D DEFICIENCY, FRACTURES): VITD: 37.87 ng/mL (ref 30.00–100.00)

## 2022-11-28 NOTE — Assessment & Plan Note (Signed)
Controlled.  Continue to monitor.  

## 2022-11-28 NOTE — Assessment & Plan Note (Signed)
Continue vitamin D supplements. Repeat vitamin D level pending.

## 2022-11-28 NOTE — Assessment & Plan Note (Signed)
Repeat bone density scan due and ordered.  Continue vitamin D and calcium. Intolerant to bisphosphonate treatment.  Consider Prolia.

## 2022-11-28 NOTE — Progress Notes (Signed)
Subjective:    Patient ID: Jackie Oneal, female    DOB: [birth year], 77 y.o.   MRN: [id number]  HPI  Jackie Oneal is a very pleasant 77 y.o. female who presents today for complete physical and follow up of chronic conditions.  Immunizations: -Tetanus: Completed in 2015 -Influenza: Completed this season -Shingles: Completed Zostavax -Pneumonia: Completed Prevnar 13 in 2017 and pneumovax in 2015  Diet: Fair diet.  Exercise: Exercising 2 days weekly   Eye exam: Completes annually  Dental exam: Completes semi-annually    Mammogram: April 2023 Bone Density Scan: February 2022  Colonoscopy: Completed in 2016, due 2026  BP Readings from Last 3 Encounters:  11/28/22 (!) 102/58  07/30/22 96/64  10/02/21 108/62       Review of Systems  Constitutional:  Negative for unexpected weight change.  HENT:  Negative for rhinorrhea.   Respiratory:  Negative for cough and shortness of breath.   Cardiovascular:  Negative for chest pain.  Gastrointestinal:  Negative for constipation and diarrhea.  Genitourinary:  Negative for difficulty urinating.  Musculoskeletal:  Negative for arthralgias and myalgias.  Skin:  Negative for rash.  Allergic/Immunologic: Negative for environmental allergies.  Neurological:  Negative for dizziness and headaches.  Psychiatric/Behavioral:  The patient is not nervous/anxious.          Past Medical History:  Diagnosis Date   Arthritis    Cataract 2019   resolved with surgery by Dr. Druscilla Oneal   Chest pain 01/14/2019   Chicken pox    Female dyspareunia 09/29/2018   GERD (gastroesophageal reflux disease) 01/21/2019   Osteoporosis    Syncope and collapse 03/19/2018   Vertigo     Social History   Socioeconomic History   Marital status: Married    Spouse name: Not on file   Number of children: Not on file   Years of education: Not on file   Highest education level: Not on file  Occupational History   Not on file  Tobacco Use   Smoking  status: Never   Smokeless tobacco: Never  Vaping Use   Vaping Use: Never used  Substance and Sexual Activity   Alcohol use: Yes    Alcohol/week: 0.0 standard drinks of alcohol    Comment: social   Drug use: No   Sexual activity: Yes  Other Topics Concern   Not on file  Social History Narrative   Married.   2 sons. 3 grandchildren.   Retired. Worked as a Naval architect.    Enjoys walking, decorating, reading, sewing.   Social Determinants of Health   Financial Resource Strain: Low Risk  (08/30/2022)   Overall Financial Resource Strain (CARDIA)    Difficulty of Paying Living Expenses: Not hard at all  Food Insecurity: No Food Insecurity (08/30/2022)   Hunger Vital Sign    Worried About Running Out of Food in the Last Year: Never true    Ran Out of Food in the Last Year: Never true  Transportation Needs: No Transportation Needs (08/30/2022)   PRAPARE - Administrator, Civil Service (Medical): No    Lack of Transportation (Non-Medical): No  Physical Activity: Sufficiently Active (08/30/2022)   Exercise Vital Sign    Days of Exercise per Week: 6 days    Minutes of Exercise per Session: 40 min  Stress: No Stress Concern Present (08/30/2022)   [employer] of Occupational Health - Occupational Stress Questionnaire    Feeling of Stress : Not at all  Social Connections: Unknown (08/30/2022)  Social Connection and Isolation Panel [NHANES]    Frequency of Communication with Friends and Family: Three times a week    Frequency of Social Gatherings with Friends and Family: Twice a week    Attends Religious Services: Not on Marketing executive or Organizations: Yes    Attends Banker Meetings: More than 4 times per year    Marital Status: Married  Catering manager Violence: Not At Risk (08/30/2022)   Humiliation, Afraid, Rape, and Kick questionnaire    Fear of Current or Ex-Partner: No    Emotionally Abused: No    Physically Abused: No    Sexually  Abused: No    Past Surgical History:  Procedure Laterality Date   BREAST BIOPSY Left 09/29/2020   [place] Bx, Q clip, neg   CATARACT EXTRACTION W/PHACO Left 03/18/2017   Procedure: CATARACT EXTRACTION PHACO AND INTRAOCULAR LENS PLACEMENT ([facility]);  Surgeon: Jackie Manila, MD;  Location: [facility] ORS;  Service: Ophthalmology;  Laterality: Left;  [place] 01:23.9 AP% 20.9 CDE 17.58 Fluid Pack lot # [id number]   CATARACT EXTRACTION W/PHACO Right 11/25/2017   Procedure: CATARACT EXTRACTION PHACO AND INTRAOCULAR LENS PLACEMENT ([facility]);  Surgeon: Jackie Manila, MD;  Location: [facility] ORS;  Service: Ophthalmology;  Laterality: Right;  [place] 00:41.5 AP% 16.7 CDE 6.95 Fluid Pack Lot # [id number]   EYE SURGERY Left 03/2016   retina    Family History  Problem Relation Age of Onset   Arthritis Mother     Allergies  Allergen Reactions   Sulfa Antibiotics Other (See Comments)    Childhood allergy    Current Outpatient Medications on File Prior to Visit  Medication Sig Dispense Refill   acetaminophen (TYLENOL) 325 MG tablet Take 2 tablets (650 mg total) by mouth every 4 (four) hours as needed for headache or mild pain. 30 tablet 0   Calcium Carbonate-Vitamin D (CALCIUM-VITAMIN D3 PO) Take 1 tablet by mouth daily.     lactase (LACTAID) 3000 units tablet Take 3,000 Units by mouth daily as needed (for lactose intolerant).      loratadine (CLARITIN) 10 MG tablet Take 10 mg by mouth daily as needed for allergies.     naproxen sodium (ALEVE) 220 MG tablet Take 220 mg by mouth daily as needed (for pain or headache).     OVER THE COUNTER MEDICATION Eye drops as needed.     No current facility-administered medications on file prior to visit.    BP (!) 102/58   Pulse 73   Temp 97.6 F (36.4 C) (Temporal)   Ht 5' 0.5" (1.537 m)   Wt 115 lb (52.2 kg)   SpO2 96%   BMI 22.09 kg/m  Objective:   Physical Exam HENT:     Right Ear: Tympanic membrane and ear canal normal.     Left Ear: Tympanic membrane and ear  canal normal.     Nose: Nose normal.  Eyes:     Conjunctiva/sclera: Conjunctivae normal.     Pupils: Pupils are equal, round, and reactive to light.  Neck:     Thyroid: No thyromegaly.  Cardiovascular:     Rate and Rhythm: Normal rate and regular rhythm.     Heart sounds: No murmur heard. Pulmonary:     Effort: Pulmonary effort is normal.     Breath sounds: Normal breath sounds. No rales.  Abdominal:     General: Bowel sounds are normal.     Palpations: Abdomen is soft.     Tenderness: There  is no abdominal tenderness.  Musculoskeletal:        General: Normal range of motion.     Cervical back: Neck supple.  Lymphadenopathy:     Cervical: No cervical adenopathy.  Skin:    General: Skin is warm and dry.     Findings: No rash.  Neurological:     Mental Status: She is alert and oriented to person, place, and time.     Cranial Nerves: No cranial nerve deficit.     Deep Tendon Reflexes: Reflexes are normal and symmetric.  Psychiatric:        Mood and Affect: Mood normal.           Assessment & Plan:  Preventative health care Assessment & Plan: Immunizations UTD, discussed Shingrix.  Mammogram and bone density scan due, orders placed. Colonoscopy UTD, due 2026  Discussed the importance of a healthy diet and regular exercise in order for weight loss, and to reduce the risk of further co-morbidity.  Exam stable. Labs pending.  Follow up in 1 year for repeat physical.    Benign paroxysmal positional vertigo, unspecified laterality Assessment & Plan: Controlled.  Continue to monitor.   Vitamin D deficiency Assessment & Plan: Continue vitamin D supplements. Repeat vitamin D level pending.  Orders: -     VITAMIN D 25 Hydroxy (Vit-D Deficiency, Fractures)  Hyperlipidemia, unspecified hyperlipidemia type Assessment & Plan: Repeat lipid panel pending.  Continue regular exercise and a healthy diet.   Orders: -     Lipid panel -     Comprehensive metabolic  panel -     CBC  Estrogen deficiency -     DG Bone Density; Future  Screening mammogram for breast cancer -     3D Screening Mammogram, Left and Right; Future  Age-related osteoporosis without current pathological fracture Assessment & Plan: Repeat bone density scan due and ordered.  Continue vitamin D and calcium. Intolerant to bisphosphonate treatment.  Consider Prolia.           Doreene Nest, NP

## 2022-11-28 NOTE — Assessment & Plan Note (Signed)
Immunizations UTD, discussed Shingrix.  Mammogram and bone density scan due, orders placed. Colonoscopy UTD, due 2026  Discussed the importance of a healthy diet and regular exercise in order for weight loss, and to reduce the risk of further co-morbidity.  Exam stable. Labs pending.  Follow up in 1 year for repeat physical.

## 2022-11-28 NOTE — Assessment & Plan Note (Signed)
Repeat lipid panel pending.  Continue regular exercise and a healthy diet.

## 2022-11-28 NOTE — Patient Instructions (Signed)
Stop by the lab prior to leaving today. I will notify you of your results once received.   [facility] to schedule your mammogram and bone density scan.   It was a pleasure to see you today!   

## 2023-01-13 ENCOUNTER — Ambulatory Visit
Admission: RE | Admit: 2023-01-13 | Discharge: 2023-01-13 | Disposition: A | Discharge status: Home / Self Care | Payer: PPO | Source: Ambulatory Visit | Attending: Primary Care | Admitting: Primary Care

## 2023-01-13 DIAGNOSIS — M810 Age-related osteoporosis without current pathological fracture: Secondary | ICD-10-CM | POA: Diagnosis not present

## 2023-01-13 DIAGNOSIS — Z1231 Encounter for screening mammogram for malignant neoplasm of breast: Secondary | ICD-10-CM | POA: Insufficient documentation

## 2023-01-13 DIAGNOSIS — E2839 Other primary ovarian failure: Secondary | ICD-10-CM | POA: Insufficient documentation

## 2023-02-03 ENCOUNTER — Ambulatory Visit: Payer: PPO | Admitting: Dermatology

## 2023-05-19 ENCOUNTER — Ambulatory Visit: Payer: PPO | Admitting: Dermatology

## 2023-05-21 ENCOUNTER — Ambulatory Visit (INDEPENDENT_AMBULATORY_CARE_PROVIDER_SITE_OTHER): Payer: PPO | Admitting: Dermatology

## 2023-05-21 DIAGNOSIS — Z7189 Other specified counseling: Secondary | ICD-10-CM

## 2023-05-21 DIAGNOSIS — W908XXA Exposure to other nonionizing radiation, initial encounter: Secondary | ICD-10-CM

## 2023-05-21 DIAGNOSIS — L570 Actinic keratosis: Secondary | ICD-10-CM | POA: Diagnosis not present

## 2023-05-21 DIAGNOSIS — L821 Other seborrheic keratosis: Secondary | ICD-10-CM | POA: Diagnosis not present

## 2023-05-21 DIAGNOSIS — L578 Other skin changes due to chronic exposure to nonionizing radiation: Secondary | ICD-10-CM | POA: Diagnosis not present

## 2023-05-21 DIAGNOSIS — Z5111 Encounter for antineoplastic chemotherapy: Secondary | ICD-10-CM | POA: Diagnosis not present

## 2023-05-21 MED ORDER — FLUOROURACIL 5 % EX CREA: TOPICAL_CREAM | CUTANEOUS | 0 refills | Status: DC

## 2023-05-21 MED ORDER — CALCIPOTRIENE 0.005 % EX CREA: TOPICAL_CREAM | CUTANEOUS | 0 refills | Status: DC

## 2023-05-21 NOTE — Progress Notes (Signed)
Follow-Up Visit   Subjective  Jackie Oneal is a 77 y.o. female who presents for the following: Actinic keratosis 6 month follow-up. The patient was prescribed 5FU/Calcipotriene cream at last visit to use on face, but she did not get/use. The patient has spots, moles and lesions to be evaluated, some may be new or changing and the patient may have concern these could be cancer.   The following portions of the chart were reviewed this encounter and updated as appropriate: medications, allergies, medical history  Review of Systems:  No other skin or systemic complaints except as noted in HPI or Assessment and Plan.  Objective  Well appearing patient in no apparent distress; mood and affect are within normal limits.  A focused examination was performed of the following areas: Face, arms, hands  Relevant exam findings are noted in the Assessment and Plan.  L cheek x 2, R upper forehead x 3, R nasal dorsum x 1, R upper eyebrow x 2 (8) Pink scaly macules.     Assessment & Plan   AK (actinic keratosis) (8) L cheek x 2, R upper forehead x 3, R nasal dorsum x 1, R upper eyebrow x 2  Actinic keratoses are precancerous spots that appear secondary to cumulative UV radiation exposure/sun exposure over time. They are chronic with expected duration over 1 year. A portion of actinic keratoses will progress to squamous cell carcinoma of the skin. It is not possible to reliably predict which spots will progress to skin cancer and so treatment is recommended to prevent development of skin cancer.  Recommend daily broad spectrum sunscreen SPF 30+ to sun-exposed areas, reapply every 2 hours as needed.  Recommend staying in the shade or wearing long sleeves, sun glasses (UVA+UVB protection) and wide brim hats (4-inch brim around the entire circumference of the hat). Call for new or changing lesions.  Destruction of lesion - L cheek x 2, R upper forehead x 3, R nasal dorsum x 1, R upper eyebrow x 2  (8)  Destruction method: cryotherapy   Informed consent: discussed and consent obtained   Lesion destroyed using liquid nitrogen: Yes   Region frozen until ice ball extended beyond lesion: Yes   Outcome: patient tolerated procedure well with no complications   Post-procedure details: wound care instructions given   Additional details:  Prior to procedure, discussed risks of blister formation, small wound, skin dyspigmentation, or rare scar following cryotherapy. Recommend Vaseline ointment to treated areas while healing.   ACTINIC DAMAGE WITH PRECANCEROUS ACTINIC KERATOSES Counseling for Topical Chemotherapy Management: Patient exhibits: - Severe, confluent actinic changes with pre-cancerous actinic keratoses that is secondary to cumulative UV radiation exposure over time - Condition that is severe; chronic, not at goal. - diffuse scaly erythematous macules and papules with underlying dyspigmentation - Discussed Prescription "Field Treatment" topical Chemotherapy for Severe, Chronic Confluent Actinic Changes with Pre-Cancerous Actinic Keratoses Field treatment involves treatment of an entire area of skin that has confluent Actinic Changes (Sun/ Ultraviolet light damage) and PreCancerous Actinic Keratoses by method of PhotoDynamic Therapy (PDT) and/or prescription Topical Chemotherapy agents such as 5-fluorouracil, 5-fluorouracil/calcipotriene, and/or imiquimod.  The purpose is to decrease the number of clinically evident and subclinical PreCancerous lesions to prevent progression to development of skin cancer by chemically destroying early precancer changes that may or may not be visible.  It has been shown to reduce the risk of developing skin cancer in the treated area. As a result of treatment, redness, scaling, crusting, and open sores may  occur during treatment course. One or more than one of these methods may be used and may have to be used several times to control, suppress and eliminate the  PreCancerous changes. Discussed treatment course, expected reaction, and possible side effects. - Recommend daily broad spectrum sunscreen SPF 30+ to sun-exposed areas, reapply every 2 hours as needed.  - Staying in the shade or wearing long sleeves, sun glasses (UVA+UVB protection) and wide brim hats (4-inch brim around the entire circumference of the hat) are also recommended. - Call for new or changing lesions. - Start 5-fluorouracil/calcipotriene cream twice a day for 5 days to affected areas including face. Prescription was sent to [facility], but pt prefers sending to local pharmacy.    Reviewed course of treatment and expected reaction.  Patient advised to expect inflammation and crusting and advised that erosions are possible.  Patient advised to be diligent with sun protection during and after treatment. Counseled to keep medication out of reach of children and pets.   SEBORRHEIC KERATOSIS - Stuck-on, waxy, tan-brown papules and/or plaques, including right jaw  - Benign-appearing - Discussed benign etiology and prognosis. - Observe - Call for any changes   Return in about 6 months (around 11/19/2023) for AKs.  ICherlyn Labella, CMA, am acting as scribe for Willeen Niece, MD .   Documentation: I have reviewed the above documentation for accuracy and completeness, and I agree with the above.  Willeen Niece, MD

## 2023-05-21 NOTE — Patient Instructions (Addendum)
- Start 5-fluorouracil/calcipotriene cream twice a day for 4-5 days to affected areas including face.  Patient provided with handout reviewing treatment course and side effects and advised to call or message [place] on MyChart with any concerns.  Reviewed course of treatment and expected reaction.  Patient advised to expect inflammation and crusting and advised that erosions are possible.  Patient advised to be diligent with sun protection during and after treatment. Counseled to keep medication out of reach of children and pets.  5-Fluorouracil/Calcipotriene Patient Education   Actinic keratoses are the dry, red scaly spots on the skin caused by sun damage. A portion of these spots can turn into skin cancer with time, and treating them can help prevent development of skin cancer.   Treatment of these spots requires removal of the defective skin cells. There are various ways to remove actinic keratoses, including freezing with liquid nitrogen, treatment with creams, or treatment with a blue light procedure in the office.   5-fluorouracil cream is a topical cream used to treat actinic keratoses. It works by interfering with the growth of abnormal fast-growing skin cells, such as actinic keratoses. These cells peel off and are replaced by healthy ones.   5-fluorouracil/calcipotriene is a combination of the 5-fluorouracil cream with a vitamin D analog cream called calcipotriene. The calcipotriene alone does not treat actinic keratoses. However, when it is combined with 5-fluorouracil, it helps the 5-fluorouracil treat the actinic keratoses much faster so that the same results can be achieved with a much shorter treatment time.  INSTRUCTIONS FOR 5-FLUOROURACIL/CALCIPOTRIENE CREAM:   5-fluorouracil/calcipotriene cream typically only needs to be used for 4-7 days. A thin layer should be applied twice a day to the treatment areas recommended by your physician.   If your physician prescribed you separate tubes  of 5-fluourouracil and calcipotriene, apply a thin layer of 5-fluorouracil followed by a thin layer of calcipotriene.   Avoid contact with your eyes, nostrils, and mouth. Do not use 5-fluorouracil/calcipotriene cream on infected or open wounds.   You will develop redness, irritation and some crusting at areas where you have pre-cancer damage/actinic keratoses. IF YOU DEVELOP PAIN, BLEEDING, OR SIGNIFICANT CRUSTING, STOP THE TREATMENT EARLY - you have already gotten a good response and the actinic keratoses should clear up well.  Wash your hands after applying 5-fluorouracil 5% cream on your skin.   A moisturizer or sunscreen with a minimum SPF 30 should be applied each morning.   Once you have finished the treatment, you can apply a thin layer of Vaseline twice a day to irritated areas to soothe and calm the areas more quickly. If you experience significant discomfort, contact your physician.  For some patients it is necessary to repeat the treatment for best results.  SIDE EFFECTS: When using 5-fluorouracil/calcipotriene cream, you may have mild irritation, such as redness, dryness, swelling, or a mild burning sensation. This usually resolves within 2 weeks. The more actinic keratoses you have, the more redness and inflammation you can expect during treatment. Eye irritation has been reported rarely. If this occurs, please let us know.  If you have any trouble using this cream, please call the office. If you have any other questions about this information, please do not hesitate to ask me before you leave the office.    Due to recent changes in healthcare laws, you may see results of your pathology and/or laboratory studies on MyChart before the doctors have had a chance to review them. We understand that in some cases there  may be results that are confusing or concerning to you. Please understand that not all results are received at the same time and often the doctors may need to interpret  multiple results in order to provide you with the best plan of care or course of treatment. Therefore, we ask that you please give [place] 2 business days to thoroughly review all your results before contacting the office for clarification. Should we see a critical lab result, you will be contacted sooner.   If You Need Anything After Your Visit  If you have any questions or concerns for your doctor, please call our main line at [phone] and press option 4 to reach your doctor's medical assistant. If no one answers, please leave a voicemail as directed and we will return your call as soon as possible. Messages left after 4 pm will be answered the following business day.   You may also send [place] a message via MyChart. We typically respond to MyChart messages within 1-2 business days.  For prescription refills, please ask your pharmacy to contact our office. Our fax number is [phone].  If you have an urgent issue when the clinic is closed that cannot wait until the next business day, you can page your doctor at the number below.    Please note that while we do our best to be available for urgent issues outside of office hours, we are not available 24/7.   If you have an urgent issue and are unable to reach [place], you may choose to seek medical care at your doctor's office, retail clinic, urgent care center, or emergency room.  If you have a medical emergency, please immediately call 911 or go to the emergency department.  Pager Numbers  - Dr. Gwen Pounds: [phone]  - Dr. Roseanne Reno: [phone]  - Dr. Katrinka Blazing: [phone]   In the event of inclement weather, please call our main line at [phone] for an update on the status of any delays or closures.  Dermatology Medication Tips: Please keep the boxes that topical medications come in in order to help keep track of the instructions about where and how to use these. Pharmacies typically print the medication instructions only on the boxes  and not directly on the medication tubes.   If your medication is too expensive, please contact our office at [phone] option 4 or send [place] a message through MyChart.   We are unable to tell what your co-pay for medications will be in advance as this is different depending on your insurance coverage. However, we may be able to find a substitute medication at lower cost or fill out paperwork to get insurance to cover a needed medication.   If a prior authorization is required to get your medication covered by your insurance company, please allow [place] 1-2 business days to complete this process.  Drug prices often vary depending on where the prescription is filled and some pharmacies may offer cheaper prices.  The website www.goodrx.com contains coupons for medications through different pharmacies. The prices here do not account for what the cost may be with help from insurance (it may be cheaper with your insurance), but the website can give you the price if you did not use any insurance.  - You can print the associated coupon and take it with your prescription to the pharmacy.  - You may also stop by our office during regular business hours and pick up a GoodRx coupon card.  - If you need your prescription sent electronically  to a different pharmacy, notify our office through [facility] or by phone at [phone] option 4.     Si Usted Necesita Algo Despus de Su Visita  Tambin puede enviarnos un mensaje a travs de Clinical cytogeneticist. Por lo general respondemos a los mensajes de MyChart en el transcurso de 1 a 2 das hbiles.  Para renovar recetas, por favor pida a su farmacia que se ponga en contacto con nuestra oficina. Annie Sable de fax es [place] [phone].  Si tiene un asunto urgente cuando la clnica est cerrada y que no puede esperar hasta el siguiente da hbil, puede llamar/localizar a su doctor(a) al nmero que aparece a continuacin.   Por favor, tenga en cuenta que aunque  hacemos todo lo posible para estar disponibles para asuntos urgentes fuera del horario de [place], no estamos disponibles las 24 horas del da, los 7 [address]  [address] de la [place].   Si tiene un problema urgente y no puede comunicarse con nosotros, puede optar por buscar atencin mdica  en el consultorio de su doctor(a), en una clnica privada, en un centro de atencin urgente o en una sala de emergencias.  Si tiene Engineer, drilling, por favor llame inmediatamente al 911 o vaya a la sala de emergencias.  Nmeros de bper  - Dr. Gwen Pounds: [phone]  - Dra. Roseanne Reno: [phone]  - Dr. Katrinka Blazing: [phone]   En caso de inclemencias del tiempo, por favor llame a Lacy Duverney principal al [phone] para una actualizacin sobre el [place] de cualquier retraso o cierre.  Consejos para la medicacin en dermatologa: Por favor, guarde las cajas en las que vienen los medicamentos de uso tpico para ayudarle a seguir las instrucciones sobre dnde y cmo usarlos. Las farmacias generalmente imprimen las instrucciones del medicamento slo en las cajas y no directamente en los tubos del [place].   Si su medicamento es muy caro, por favor, pngase en contacto con Rolm [place]ala llamando al [phone] y presione la opcin 4 o envenos un mensaje a travs de Clinical cytogeneticist.   No podemos decirle cul ser su copago por los medicamentos por adelantado ya que esto es diferente dependiendo de la cobertura de su seguro. Sin embargo, es posible que podamos encontrar un medicamento sustituto a Audiological scientist un formulario para que el seguro cubra el medicamento que se considera necesario.   Si se requiere una autorizacin previa para que su compaa de seguros [place] su medicamento, por favor permtanos de 1 a 2 das hbiles para completar [address]  Los precios de los medicamentos varan con frecuencia dependiendo del Environmental consultant de dnde se surte la receta y alguna farmacias pueden ofrecer precios ms  baratos.  El sitio web www.goodrx.com tiene cupones para medicamentos de Health and safety inspector. Los precios aqu no tienen en cuenta lo que podra costar con la ayuda del seguro (puede ser ms barato con su seguro), pero el sitio web puede darle el precio si no utiliz Tourist information centre manager.  - Puede imprimir el cupn correspondiente y llevarlo con su receta a la farmacia.  - Tambin puede pasar por nuestra oficina durante el horario de atencin regular y Education officer, museum una tarjeta de cupones de GoodRx.  - Si necesita que su receta se enve electrnicamente a una farmacia diferente, informe a nuestra oficina a travs de MyChart de [place] o por telfono llamando al [phone] y presione la opcin 4.

## 2023-08-11 DIAGNOSIS — Z961 Presence of intraocular lens: Secondary | ICD-10-CM | POA: Diagnosis not present

## 2023-08-11 DIAGNOSIS — H43813 Vitreous degeneration, bilateral: Secondary | ICD-10-CM | POA: Diagnosis not present

## 2023-08-11 DIAGNOSIS — H35342 Macular cyst, hole, or pseudohole, left eye: Secondary | ICD-10-CM | POA: Diagnosis not present

## 2023-08-11 DIAGNOSIS — M3501 Sicca syndrome with keratoconjunctivitis: Secondary | ICD-10-CM | POA: Diagnosis not present

## 2023-09-02 ENCOUNTER — Ambulatory Visit (INDEPENDENT_AMBULATORY_CARE_PROVIDER_SITE_OTHER): Payer: PPO | Admitting: *Deleted

## 2023-09-02 VITALS — Ht 61.0 in | Wt 115.0 lb

## 2023-09-02 DIAGNOSIS — Z0000 Encounter for general adult medical examination without abnormal findings: Secondary | ICD-10-CM | POA: Diagnosis not present

## 2023-09-02 NOTE — Progress Notes (Signed)
 Subjective:   Jackie Oneal is a 78 y.o. female who presents for Medicare Annual (Subsequent) preventive examination.  Visit Complete: Virtual I connected with  Jackie Oneal on 09/02/23 by a audio enabled telemedicine application and verified that I am speaking with the correct person using two identifiers.This patient declined Interactive audio and acupuncturist. Therefore the visit was completed with audio only.   Patient Location: Home  Provider Location: Office/Clinic  I discussed the limitations of evaluation and management by telemedicine. The patient expressed understanding and agreed to proceed.  Vital Signs: Because this visit was a virtual/telehealth visit, some criteria may be missing or patient reported. Any vitals not documented were not able to be obtained and vitals that have been documented are patient reported.  Patient Medicare AWV questionnaire was completed by the patient on 09/02/23; I have confirmed that all information answered by patient is correct and no changes since this date.  Cardiac Risk Factors include: advanced age (>78men, >23 women);dyslipidemia     Objective:    Today's Vitals   09/02/23 0931  Weight: 115 lb (52.2 kg)  Height: 5' 1 (1.549 m)   Body mass index is 21.73 kg/m.     09/02/2023    9:41 AM 08/30/2022    8:33 AM 08/29/2021   12:01 PM 12/28/2019    8:15 AM 01/14/2019    3:25 AM 05/01/2018   10:10 AM 03/19/2018   12:55 PM  Advanced Directives  Does Patient Have a Medical Advance Directive? Yes Yes Yes Yes No Yes Yes  Type of Estate Agent of [place];Living will Healthcare Power of [place];Living will Healthcare Power of [place];Living will Healthcare Power of [place];Living will  Healthcare Power of [place];Living will Living will;Healthcare Power of Attorney  Does patient want to make changes to medical advance directive?  No - Patient declined Yes (MAU/Ambulatory/Procedural Areas - Information given)     No - Patient declined  Copy of Healthcare Power of Attorney in Chart? No - copy requested No - copy requested  No - copy requested  No - copy requested No - copy requested  Would patient like information on creating a medical advance directive?     No - Patient declined      Current Medications (verified) Outpatient Encounter Medications as of 09/02/2023  Medication Sig   acetaminophen  (TYLENOL ) 325 MG tablet Take 2 tablets (650 mg total) by mouth every 4 (four) hours as needed for headache or mild pain.   calcipotriene  (DOVONOX) 0.005 % cream Apply twice daily to face for 4-5 days as directed.   Calcium  Carbonate-Vitamin D  (CALCIUM -VITAMIN D3 PO) Take 1 tablet by mouth daily.   fluorouracil  (EFUDEX ) 5 % cream Apply to face twice daily for 4-5 days as directed.   lactase (LACTAID) 3000 units tablet Take 3,000 Units by mouth daily as needed (for lactose intolerant).    loratadine (CLARITIN) 10 MG tablet Take 10 mg by mouth daily as needed for allergies.   naproxen sodium (ALEVE) 220 MG tablet Take 220 mg by mouth daily as needed (for pain or headache).   OVER THE COUNTER MEDICATION Eye drops as needed.   No facility-administered encounter medications on file as of 09/02/2023.    Allergies (verified) Sulfa antibiotics   History: Past Medical History:  Diagnosis Date   Arthritis    Cataract 2019   resolved with surgery by Dr. Jaye   Chest pain 01/14/2019   Chicken pox    Female dyspareunia 09/29/2018   GERD (gastroesophageal reflux  disease) 01/21/2019   Osteoporosis    Syncope and collapse 03/19/2018   Vertigo    Past Surgical History:  Procedure Laterality Date   BREAST BIOPSY Left 09/29/2020   US  Bx, Q clip, neg   CATARACT EXTRACTION W/PHACO Left 03/18/2017   Procedure: CATARACT EXTRACTION PHACO AND INTRAOCULAR LENS PLACEMENT (IOC);  Surgeon: Jaye Fallow, MD;  Location: [facility] ORS;  Service: Ophthalmology;  Laterality: Left;  US  01:23.9 AP% 20.9 CDE 17.58 Fluid Pack  lot # [id number]   CATARACT EXTRACTION W/PHACO Right 11/25/2017   Procedure: CATARACT EXTRACTION PHACO AND INTRAOCULAR LENS PLACEMENT (IOC);  Surgeon: Jaye Fallow, MD;  Location: [facility] ORS;  Service: Ophthalmology;  Laterality: Right;  US  00:41.5 AP% 16.7 CDE 6.95 Fluid Pack Lot # [id number]   EYE SURGERY Left 03/2016   retina   TOOTH EXTRACTION  2024   Family History  Problem Relation Age of Onset   Arthritis Mother    Social History   Socioeconomic History   Marital status: Married    Spouse name: Not on file   Number of children: Not on file   Years of education: Not on file   Highest education level: Not on file  Occupational History   Not on file  Tobacco Use   Smoking status: Never   Smokeless tobacco: Never  Vaping Use   Vaping status: Never Used  Substance and Sexual Activity   Alcohol use: Yes    Alcohol/week: 0.0 standard drinks of alcohol    Comment: social   Drug use: No   Sexual activity: Yes  Other Topics Concern   Not on file  Social History Narrative   Married.   2 sons. 3 grandchildren.   Retired. Worked as a naval architect.    Enjoys walking, decorating, reading, sewing.   Social Drivers of Corporate Investment Banker Strain: Low Risk  (09/02/2023)   Overall Financial Resource Strain (CARDIA)    Difficulty of Paying Living Expenses: Not hard at all  Food Insecurity: No Food Insecurity (09/02/2023)   Hunger Vital Sign    Worried About Running Out of Food in the Last Year: Never true    Ran Out of Food in the Last Year: Never true  Transportation Needs: No Transportation Needs (09/02/2023)   PRAPARE - Administrator, Civil Service (Medical): No    Lack of Transportation (Non-Medical): No  Physical Activity: Sufficiently Active (09/02/2023)   Exercise Vital Sign    Days of Exercise per Week: 6 days    Minutes of Exercise per Session: 40 min  Stress: No Stress Concern Present (09/02/2023)   [employer] of Occupational Health -  Occupational Stress Questionnaire    Feeling of Stress : Not at all  Social Connections: Moderately Integrated (09/02/2023)   Social Connection and Isolation Panel [NHANES]    Frequency of Communication with Friends and Family: More than three times a week    Frequency of Social Gatherings with Friends and Family: Twice a week    Attends Religious Services: Never    Database Administrator or Organizations: Yes    Attends Engineer, Structural: More than 4 times per year    Marital Status: Married    Tobacco Counseling Counseling given: Not Answered   Clinical Intake:  Pre-visit preparation completed: Yes  Pain : No/denies pain     BMI - recorded: 21.73 Nutritional Status: BMI of 19-24  Normal Nutritional Risks: None Diabetes: No  How often do you need to have  someone help you when you read instructions, pamphlets, or other written materials from your doctor or pharmacy?: 1 - Never  Interpreter Needed?: No  Information entered by :: R. Audriella Blakeley LPN   Activities of Daily Living    09/02/2023    7:25 AM 11/28/2022   10:13 AM  In your present state of health, do you have any difficulty performing the following activities:  Hearing? 0 0  Vision? 0 0  Difficulty concentrating or making decisions? 0 0  Walking or climbing stairs? 0 0  Dressing or bathing? 0 0  Doing errands, shopping? 0 0  Preparing Food and eating ? N   Using the Toilet? N   In the past six months, have you accidently leaked urine? N   Do you have problems with loss of bowel control? N   Managing your Medications? N   Managing your Finances? N   Housekeeping or managing your Housekeeping? N     Patient Care Team: Gretta Comer POUR, NP as PCP - General (Internal Medicine)  Indicate any recent Medical Services you may have received from other than [facility] providers in the past year (date may be approximate).     Assessment:   This is a routine wellness examination for Laurina.  Hearing/Vision  screen Hearing Screening - Comments:: No issues Vision Screening - Comments:: Glasses   Goals Addressed             This Visit's Progress    Patient Stated       Patient Stated       Wants to walk more and do more       Depression Screen    09/02/2023    9:36 AM 11/28/2022   10:13 AM 08/30/2022    8:35 AM 08/29/2021   12:03 PM 05/29/2021    9:41 AM 05/24/2020    9:18 AM 12/28/2019    8:17 AM  PHQ 2/9 Scores  PHQ - 2 Score 0 0 0 0 0 0 0  PHQ- 9 Score 1 1    0 0    Fall Risk    09/02/2023    7:25 AM 11/28/2022   10:13 AM 08/23/2022   10:52 AM 08/29/2021   12:02 PM 05/29/2021    9:40 AM  Fall Risk   Falls in the past year? 0 0 0 0 0  Number falls in past yr: 0 0 0 0 0  Injury with Fall? 0 0 0 0   Risk for fall due to : No Fall Risks No Fall Risks  No Fall Risks No Fall Risks  Follow up Falls prevention discussed;Falls evaluation completed Falls evaluation completed  Falls prevention discussed     MEDICARE RISK AT HOME: Medicare Risk at Home Any stairs in or around the home?: (Patient-Rptd) Yes If so, are there any without handrails?: (Patient-Rptd) No Home free of loose throw rugs in walkways, pet beds, electrical cords, etc?: (Patient-Rptd) Yes Adequate lighting in your home to reduce risk of falls?: (Patient-Rptd) Yes Life alert?: (Patient-Rptd) No Use of a cane, walker or w/c?: (Patient-Rptd) No Grab bars in the bathroom?: (Patient-Rptd) No Shower chair or bench in shower?: (Patient-Rptd) Yes Elevated toilet seat or a handicapped toilet?: (Patient-Rptd) No   Cognitive Function:    12/28/2019    8:22 AM 05/01/2018    1:16 PM 04/08/2017    1:59 PM  MMSE - Mini Mental State Exam  Orientation to time 5 5 5   Orientation to Place 5 5 5  Registration 3 3 3   Attention/ Calculation 5 0 0  Recall 3 3 3   Language- name 2 objects  0 0  Language- repeat 1 1 1   Language- follow 3 step command  3 3  Language- read & follow direction  0 0  Write a sentence  0 0  Copy design  0  0  Total score  20 20        09/02/2023    9:42 AM  6CIT Screen  What Year? 0 points  What month? 0 points  What time? 0 points  Count back from 20 0 points  Months in reverse 0 points  Repeat phrase 0 points  Total Score 0 points    Immunizations Immunization History  Administered Date(s) Administered   DTaP 09/14/2013   Fluad Quad(high Dose 65+) 05/04/2019, 05/03/2020, 05/25/2021, 05/02/2022, 05/06/2023   Influenza, High Dose Seasonal PF 04/18/2015   Influenza,inj,Quad PF,6+ Mos 05/21/2016, 04/08/2017, 05/01/2018   Moderna Covid-19 Fall Seasonal Vaccine 55yrs & older 05/06/2023   PFIZER(Purple Top)SARS-COV-2 Vaccination 09/04/2019, 09/29/2019, 05/02/2020, 12/02/2020   Pneumococcal Conjugate-13 12/26/2015   Pneumococcal Polysaccharide-23 11/14/2013   Tdap 09/14/2013   Zoster, Live 07/24/2016    TDAP status: Up to date Will need soon  Flu Vaccine status: Up to date  Pneumococcal vaccine status: Up to date  Covid-19 vaccine status: Completed vaccines  Qualifies for Shingles Vaccine? Yes   Zostavax completed Yes   Shingrix Completed?: No.    Education has been provided regarding the importance of this vaccine. Patient has been advised to call insurance company to determine out of pocket expense if they have not yet received this vaccine. Advised may also receive vaccine at local pharmacy or Health Dept. Verbalized acceptance and understanding.  Screening Tests Health Maintenance  Topic Date Due   Zoster Vaccines- Shingrix (1 of 2) 03/09/1965   COVID-19 Vaccine (6 - 2024-25 season) 07/01/2023   Medicare Annual Wellness (AWV)  08/31/2023   DTaP/Tdap/Td (3 - Td or Tdap) 09/15/2023   Colonoscopy  01/09/2025   Pneumonia Vaccine 43+ Years old  Completed   INFLUENZA VACCINE  Completed   DEXA SCAN  Completed   Hepatitis C Screening  Completed   HPV VACCINES  Aged Out    Health Maintenance  Health Maintenance Due  Topic Date Due   Zoster Vaccines- Shingrix (1 of 2)  03/09/1965   COVID-19 Vaccine (6 - 2024-25 season) 07/01/2023   Medicare Annual Wellness (AWV)  08/31/2023    Colorectal cancer screening: Type of screening: Colonoscopy. Completed 12/2014. Repeat every 10 years  Mammogram status: Completed 08/2022. Repeat every year  Bone Density status: Completed 12/2022. Results reflect: Bone density results: OSTEOPOROSIS. Repeat every 2 years.  Lung Cancer Screening: (Low Dose CT Chest recommended if Age 76-80 years, 20 pack-year currently smoking OR have quit w/in 15years.) does not qualify.    Additional Screening:  Hepatitis C Screening: does qualify; Completed 11/2015  Vision Screening: Recommended annual ophthalmology exams for early detection of glaucoma and other disorders of the eye. Is the patient up to date with their annual eye exam?  Yes  Who is the provider or what is the name of the office in which the patient attends annual eye exams? [facility] If pt is not established with a provider, would they like to be referred to a provider to establish care? No .   Dental Screening: Recommended annual dental exams for proper oral hygiene    Community Resource Referral / Chronic Care Management: CRR required  this visit?  No   CCM required this visit?  No     Plan:     I have personally reviewed and noted the following in the patient's chart:   Medical and social history Use of alcohol, tobacco or illicit drugs  Current medications and supplements including opioid prescriptions. Patient is not currently taking opioid prescriptions. Functional ability and status Nutritional status Physical activity Advanced directives List of other physicians Hospitalizations, surgeries, and ER visits in previous 12 months Vitals Screenings to include cognitive, depression, and falls Referrals and appointments  In addition, I have reviewed and discussed with patient certain preventive protocols, quality metrics, and best practice  recommendations. A written personalized care plan for preventive services as well as general preventive health recommendations were provided to patient.     Angeline Fredericks, LPN   01/30/7973   After Visit Summary: (MyChart) Due to this being a telephonic visit, the after visit summary with patients personalized plan was offered to patient via MyChart   Nurse Notes: None

## 2023-09-02 NOTE — Patient Instructions (Signed)
 Jackie Oneal , Thank you for taking time to come for your Medicare Wellness Visit. I appreciate your ongoing commitment to your health goals. Please review the following plan we discussed and let me know if I can assist you in the future.   Referrals/Orders/Follow-Ups/Clinician Recommendations: Remember to update you tetanus when due and consider the shingles vaccines.  This is a list of the screening recommended for you and due dates:  Health Maintenance  Topic Date Due   Zoster (Shingles) Vaccine (1 of 2) [birth year]09[birth year]1967   COVID-19 Vaccine (6 - 2024-25 season) 07/01/2023   DTaP/Tdap/Td vaccine (3 - Td or Tdap) 09/15/2023   Mammogram  01/13/2024   Medicare Annual Wellness Visit  09/01/2024   Colon Cancer Screening  01/09/2025   Pneumonia Vaccine  Completed   Flu Shot  Completed   DEXA scan (bone density measurement)  Completed   Hepatitis C Screening  Completed   HPV Vaccine  Aged Out    Advanced directives: (Copy Requested) Please bring a copy of your health care power of attorney and living will to the office to be added to your chart at your convenience.  Next Medicare Annual Wellness Visit scheduled for next year: Yes 09/03/24 @ 9:30

## 2023-12-02 ENCOUNTER — Ambulatory Visit: Payer: PPO | Admitting: Dermatology

## 2024-04-27 ENCOUNTER — Ambulatory Visit (INDEPENDENT_AMBULATORY_CARE_PROVIDER_SITE_OTHER): Admitting: Dermatology

## 2024-04-27 DIAGNOSIS — L578 Other skin changes due to chronic exposure to nonionizing radiation: Secondary | ICD-10-CM | POA: Diagnosis not present

## 2024-04-27 DIAGNOSIS — W908XXA Exposure to other nonionizing radiation, initial encounter: Secondary | ICD-10-CM

## 2024-04-27 DIAGNOSIS — L820 Inflamed seborrheic keratosis: Secondary | ICD-10-CM

## 2024-04-27 DIAGNOSIS — L570 Actinic keratosis: Secondary | ICD-10-CM

## 2024-04-27 NOTE — Progress Notes (Signed)
 Follow-Up Visit   Subjective  Jackie Oneal is a 78 y.o. female who presents for the following: 6 month AK follow up. Patient also reports areas of concern on her chin and chest. Stay irritated.  Pt has done topical 5FU cream to face in the past.  Had big reaction.  Would prefer not to do it again.   The following portions of the chart were reviewed this encounter and updated as appropriate: medications, allergies, medical history  Review of Systems:  No other skin or systemic complaints except as noted in HPI or Assessment and Plan.  Objective  Well appearing patient in no apparent distress; mood and affect are within normal limits.   A focused examination was performed of the following areas: Scalp, Face, Chest   Relevant exam findings are noted in the Assessment and Plan.  Left clavicle x1, right jaw x1 (2) Stuck on waxy paps with erythema Mid Forehead x3 (3) Pink scaly macules  Assessment & Plan   ACTINIC DAMAGE WITH PRECANCEROUS ACTINIC KERATOSES Counseling for Topical Chemotherapy Management: Patient exhibits: - Severe, confluent actinic changes with pre-cancerous actinic keratoses that is secondary to cumulative UV radiation exposure over time - Condition that is severe; chronic, not at goal. - diffuse scaly erythematous macules and papules with underlying dyspigmentation - Discussed Prescription Field Treatment topical Chemotherapy for Severe, Chronic Confluent Actinic Changes with Pre-Cancerous Actinic Keratoses Field treatment involves treatment of an entire area of skin that has confluent Actinic Changes (Sun/ Ultraviolet light damage) and PreCancerous Actinic Keratoses by method of PhotoDynamic Therapy (PDT) and/or prescription Topical Chemotherapy agents such as 5-fluorouracil , 5-fluorouracil /calcipotriene , and/or imiquimod.  The purpose is to decrease the number of clinically evident and subclinical PreCancerous lesions to prevent progression to development of  skin cancer by chemically destroying early precancer changes that may or may not be visible.  It has been shown to reduce the risk of developing skin cancer in the treated area. As a result of treatment, redness, scaling, crusting, and open sores may occur during treatment course. One or more than one of these methods may be used and may have to be used several times to control, suppress and eliminate the PreCancerous changes. Discussed treatment course, expected reaction, and possible side effects. - Recommend daily broad spectrum sunscreen SPF 30+ to sun-exposed areas, reapply every 2 hours as needed.  - Staying in the shade or wearing long sleeves, sun glasses (UVA+UVB protection) and wide brim hats (4-inch brim around the entire circumference of the hat) are also recommended. - Call for new or changing lesions.  - Recommend red light PDT with debridement to face.  Patient will schedule.    INFLAMED SEBORRHEIC KERATOSIS (2) Left clavicle x1, right jaw x1 (2) Symptomatic, irritating, patient would like treated. Destruction of lesion - Left clavicle x1, right jaw x1 (2)  Destruction method: cryotherapy   Informed consent: discussed and consent obtained   Lesion destroyed using liquid nitrogen: Yes   Region frozen until ice ball extended beyond lesion: Yes   Outcome: patient tolerated procedure well with no complications   Post-procedure details: wound care instructions given   Additional details:  Prior to procedure, discussed risks of blister formation, small wound, skin dyspigmentation, or rare scar following cryotherapy. Recommend Vaseline ointment to treated areas while healing.   ACTINIC KERATOSIS (3) Mid Forehead x3 (3) Actinic keratoses are precancerous spots that appear secondary to cumulative UV radiation exposure/sun exposure over time. They are chronic with expected duration over 1 year. A portion of  actinic keratoses will progress to squamous cell carcinoma of the skin. It is not  possible to reliably predict which spots will progress to skin cancer and so treatment is recommended to prevent development of skin cancer.  Recommend daily broad spectrum sunscreen SPF 30+ to sun-exposed areas, reapply every 2 hours as needed.  Recommend staying in the shade or wearing long sleeves, sun glasses (UVA+UVB protection) and wide brim hats (4-inch brim around the entire circumference of the hat). Call for new or changing lesions. Destruction of lesion - Mid Forehead x3 (3)  Destruction method: cryotherapy   Informed consent: discussed and consent obtained   Lesion destroyed using liquid nitrogen: Yes   Region frozen until ice ball extended beyond lesion: Yes   Outcome: patient tolerated procedure well with no complications   Post-procedure details: wound care instructions given   Additional details:  Prior to procedure, discussed risks of blister formation, small wound, skin dyspigmentation, or rare scar following cryotherapy. Recommend Vaseline ointment to treated areas while healing.    Return in about 4 months (around 08/27/2024) for AK follow up, Nurse visit for red light treatment of the face in November .  I, Emerick Ege, CMA am acting as scribe for Rexene Rattler, MD.   Documentation: I have reviewed the above documentation for accuracy and completeness, and I agree with the above.  Rexene Rattler, MD

## 2024-04-27 NOTE — Patient Instructions (Signed)

## 2024-05-12 ENCOUNTER — Other Ambulatory Visit: Payer: Self-pay | Admitting: Primary Care

## 2024-05-12 DIAGNOSIS — Z1231 Encounter for screening mammogram for malignant neoplasm of breast: Secondary | ICD-10-CM

## 2024-06-15 ENCOUNTER — Encounter

## 2024-06-15 ENCOUNTER — Ambulatory Visit

## 2024-06-16 ENCOUNTER — Ambulatory Visit
Admission: RE | Admit: 2024-06-16 | Discharge: 2024-06-16 | Disposition: A | Discharge status: Home / Self Care | Source: Ambulatory Visit | Attending: Primary Care | Admitting: Primary Care

## 2024-06-16 DIAGNOSIS — Z1231 Encounter for screening mammogram for malignant neoplasm of breast: Secondary | ICD-10-CM | POA: Diagnosis not present

## 2024-06-21 ENCOUNTER — Ambulatory Visit: Payer: Self-pay | Admitting: Primary Care

## 2024-08-02 ENCOUNTER — Ambulatory Visit

## 2024-08-16 ENCOUNTER — Ambulatory Visit

## 2024-08-16 ENCOUNTER — Encounter

## 2024-08-19 ENCOUNTER — Ambulatory Visit: Admitting: Dermatology

## 2024-08-19 ENCOUNTER — Encounter

## 2024-08-19 DIAGNOSIS — L570 Actinic keratosis: Secondary | ICD-10-CM

## 2024-08-19 MED ORDER — AMINOLEVULINIC ACID HCL 10 % EX GEL
2000.0000 mg | Freq: Once | CUTANEOUS | Status: AC
  Administered 2024-08-19: 2000 mg via TOPICAL

## 2024-08-19 NOTE — Progress Notes (Signed)
 Patient completed red light phototherapy with debridement today to face.  ACTINIC KERATOSES Exam: Erythematous thin papules/macules with gritty scale.  Treatment Plan:  Red Light Photodynamic therapy  Procedure discussed: discussed risks, benefits, side effects. and alternatives   Prep: site scrubbed/prepped with acetone   Debridement needed: Yes (performed by Physician with sand paper.  (CPT [id number])) Location:  face Number of lesions:  Multiple (> 15) Type of treatment:  Red light Aminolevulinic Acid (see MAR for details): Ameluz  Aminolevulinic Acid comment:  [id number] Amount of Ameluz  (mg):  1 Incubation time (minutes):  60 Number of minutes under lamp:  20 Cooling:  Fan Outcome: patient tolerated procedure well with no complications   Post-procedure details: sunscreen applied and aftercare instructions given to patient    Related Medications Aminolevulinic Acid HCl 10 % GEL 2,000 mg  Amanda White, RMA  I personally debrided area prior to application of aminolevulinic acid, Rexene Rattler MD  Documentation: I have reviewed the above documentation for accuracy and completeness, and I agree with the above.  Rexene Rattler MD  ASC-NURSE ROOM

## 2024-08-19 NOTE — Patient Instructions (Signed)

## 2024-08-24 ENCOUNTER — Ambulatory Visit: Admitting: Dermatology

## 2024-09-02 ENCOUNTER — Ambulatory Visit

## 2024-09-02 ENCOUNTER — Ambulatory Visit: Admitting: Primary Care

## 2024-09-02 ENCOUNTER — Encounter: Payer: Self-pay | Admitting: Primary Care

## 2024-09-02 VITALS — BP 112/68 | HR 84 | Temp 97.9°F | Ht 60.0 in | Wt 118.1 lb

## 2024-09-02 DIAGNOSIS — E785 Hyperlipidemia, unspecified: Secondary | ICD-10-CM

## 2024-09-02 DIAGNOSIS — Z0000 Encounter for general adult medical examination without abnormal findings: Secondary | ICD-10-CM

## 2024-09-02 DIAGNOSIS — E2839 Other primary ovarian failure: Secondary | ICD-10-CM

## 2024-09-02 DIAGNOSIS — M810 Age-related osteoporosis without current pathological fracture: Secondary | ICD-10-CM

## 2024-09-02 NOTE — Assessment & Plan Note (Signed)
 Immunizations UTD. Mammogram UTD Colonoscopy due now. She is 34 so we discussed whether to proceed or not.  As she has had clean colonoscopies previously we will discontinue colon cancer screening.  She agrees.  Discussed the importance of a healthy diet and regular exercise in order for weight loss, and to reduce the risk of further co-morbidity.  Exam stable. Labs pending.  Follow up in 1 year for repeat physical.

## 2024-09-02 NOTE — Assessment & Plan Note (Signed)
 Repeat lipid panel pending.  Work on a healthy diet and regular exercise in order for weight loss, and to reduce the risk of further co-morbidity.

## 2024-09-02 NOTE — Progress Notes (Signed)
 "  Subjective:    Patient ID: Jackie Oneal, female    DOB: 1946-04-07, 79 y.o.   MRN: [id number]  Jackie Oneal is a very pleasant 79 y.o. female who presents today for complete physical and follow up of chronic conditions.  Immunizations: -Tetanus: Completed in 2015 -Influenza: Completed this season  -Shingles: Completed Zostavax -Pneumonia: Completed 2017  Diet: Fair diet.  Exercise: Regular exercise.  Eye exam: Completes annually  Dental exam: Completes semi-annually    Mammogram: Completed in November 2025 Bone Density Scan: Completed in June 2024  Colonoscopy: Completed in 2016, due 2026.      Review of Systems  Constitutional:  Negative for unexpected weight change.  HENT:  Negative for rhinorrhea.   Respiratory:  Negative for cough and shortness of breath.   Cardiovascular:  Negative for chest pain.  Gastrointestinal:  Negative for constipation and diarrhea.  Genitourinary:  Negative for difficulty urinating.  Musculoskeletal:  Negative for arthralgias and myalgias.  Skin:  Negative for rash.  Allergic/Immunologic: Negative for environmental allergies.  Neurological:  Negative for dizziness and headaches.  Psychiatric/Behavioral:  The patient is not nervous/anxious.          Past Medical History:  Diagnosis Date   Arthritis 30years ago   Cataract 2019   resolved with surgery by Dr. Jaye   Chest pain 01/14/2019   Chicken pox    Female dyspareunia 09/29/2018   GERD (gastroesophageal reflux disease) 01/21/2019   GERD   Osteoporosis    Syncope and collapse 03/19/2018   Vertigo     Social History   Socioeconomic History   Marital status: Married    Spouse name: Not on file   Number of children: Not on file   Years of education: Not on file   Highest education level: Bachelor's degree (e.g., BA, AB, BS)  Occupational History   Not on file  Tobacco Use   Smoking status: Never   Smokeless tobacco: Never  Vaping Use   Vaping status: Never  Used  Substance and Sexual Activity   Alcohol use: Yes    Alcohol/week: 0.0 standard drinks of alcohol    Comment: social   Drug use: No   Sexual activity: Yes  Other Topics Concern   Not on file  Social History Narrative   Married.   2 sons. 3 grandchildren.   Retired. Worked as a naval architect.    Enjoys walking, decorating, reading, sewing.   Social Drivers of Health   Tobacco Use: Low Risk (09/02/2024)   Patient History    Smoking Tobacco Use: Never    Smokeless Tobacco Use: Never    Passive Exposure: Not on file  Financial Resource Strain: Low Risk (08/30/2024)   Overall Financial Resource Strain (CARDIA)    Difficulty of Paying Living Expenses: Not hard at all  Food Insecurity: No Food Insecurity (08/30/2024)   Epic    Worried About Radiation Protection Practitioner of Food in the Last Year: Never true    Ran Out of Food in the Last Year: Never true  Transportation Needs: No Transportation Needs (08/30/2024)   Epic    Lack of Transportation (Medical): No    Lack of Transportation (Non-Medical): No  Physical Activity: Sufficiently Active (08/30/2024)   Exercise Vital Sign    Days of Exercise per Week: 5 days    Minutes of Exercise per Session: 30 min  Stress: No Stress Concern Present (08/30/2024)   [employer] of Occupational Health - Occupational Stress Questionnaire    Feeling of Stress:  Not at all  Social Connections: Moderately Integrated (08/30/2024)   Social Connection and Isolation Panel    Frequency of Communication with Friends and Family: More than three times a week    Frequency of Social Gatherings with Friends and Family: Once a week    Attends Religious Services: Never    Database Administrator or Organizations: Yes    Attends Engineer, Structural: More than 4 times per year    Marital Status: Married  Catering Manager Violence: Not At Risk (09/02/2023)   Humiliation, Afraid, Rape, and Kick questionnaire    Fear of Current or Ex-Partner: No    Emotionally Abused: No     Physically Abused: No    Sexually Abused: No  Depression (PHQ2-9): Low Risk (09/02/2024)   Depression (PHQ2-9)    PHQ-2 Score: 0  Alcohol Screen: Low Risk (08/30/2024)   Alcohol Screen    Last Alcohol Screening Score (AUDIT): 2  Housing: Low Risk (08/30/2024)   Epic    Unable to Pay for Housing in the Last Year: No    Number of Times Moved in the Last Year: 0    Homeless in the Last Year: No  Utilities: Not At Risk (09/02/2023)   [facility] Utilities    Threatened with loss of utilities: No  Health Literacy: Adequate Health Literacy (09/02/2023)   B1300 Health Literacy    Frequency of need for help with medical instructions: Never    Past Surgical History:  Procedure Laterality Date   BREAST BIOPSY Left 09/29/2020   US  Bx, Q clip, neg   CATARACT EXTRACTION W/PHACO Left 03/18/2017   Procedure: CATARACT EXTRACTION PHACO AND INTRAOCULAR LENS PLACEMENT (IOC);  Surgeon: Jaye Fallow, MD;  Location: [facility];  Service: Ophthalmology;  Laterality: Left;  US  01:23.9 AP% 20.9 CDE 17.58 Fluid Pack lot # [id number]   CATARACT EXTRACTION W/PHACO Right 11/25/2017   Procedure: CATARACT EXTRACTION PHACO AND INTRAOCULAR LENS PLACEMENT (IOC);  Surgeon: Jaye Fallow, MD;  Location: [facility];  Service: Ophthalmology;  Laterality: Right;  US  00:41.5 AP% 16.7 CDE 6.95 Fluid Pack Lot # [id number]   EYE SURGERY Left 03/2016   retina   TOOTH EXTRACTION  2024    Family History  Problem Relation Age of Onset   Arthritis Mother    Stroke Mother    Hearing loss Mother    Vision loss Mother     Allergies[1]  Medications Ordered Prior to Encounter[2]  BP 112/68   Pulse 84   Temp 97.9 F (36.6 C) (Oral)   Ht 5' (1.524 m)   Wt 118 lb 2 oz (53.6 kg)   SpO2 98%   BMI 23.07 kg/m  Objective:   Physical Exam HENT:     Right Ear: Tympanic membrane and ear canal normal.     Left Ear: Tympanic membrane and ear canal normal.  Eyes:     Pupils: Pupils are equal, round, and reactive to light.   Cardiovascular:     Rate and Rhythm: Normal rate and regular rhythm.  Pulmonary:     Effort: Pulmonary effort is normal.     Breath sounds: Normal breath sounds.  Abdominal:     General: Bowel sounds are normal.     Palpations: Abdomen is soft.     Tenderness: There is no abdominal tenderness.  Musculoskeletal:        General: Normal range of motion.     Cervical back: Neck supple.  Skin:    General: Skin is warm and dry.  Neurological:     Mental Status: She is alert and oriented to person, place, and time.     Cranial Nerves: No cranial nerve deficit.     Deep Tendon Reflexes:     Reflex Scores:      Patellar reflexes are 2+ on the right side and 2+ on the left side. Psychiatric:        Mood and Affect: Mood normal.     Physical Exam        Assessment & Plan:  Preventative health care Assessment & Plan: Immunizations UTD. Mammogram UTD Colonoscopy due now. She is 45 so we discussed whether to proceed or not.  As she has had clean colonoscopies previously we will discontinue colon cancer screening.  She agrees.  Discussed the importance of a healthy diet and regular exercise in order for weight loss, and to reduce the risk of further co-morbidity.  Exam stable. Labs pending.  Follow up in 1 year for repeat physical.    Age-related osteoporosis without current pathological fracture Assessment & Plan: Continue weight bearing exercise.  Repeat bone density scan pending.  Consider Prolia injections. Failed Fosamax and Boniva  due to side effects.    Hyperlipidemia, unspecified hyperlipidemia type Assessment & Plan: Repeat lipid panel pending.  Work on a healthy diet and regular exercise in order for weight loss, and to reduce the risk of further co-morbidity.   Orders: -     Lipid panel -     Comprehensive metabolic panel with GFR -     CBC  Estrogen deficiency -     DG Bone Density; Future    Assessment and Plan Assessment &  Plan         Comer MARLA Gaskins, NP       [1]  Allergies Allergen Reactions   Sulfa Antibiotics Other (See Comments)    Childhood allergy  [2]  Current Outpatient Medications on File Prior to Visit  Medication Sig Dispense Refill   acetaminophen  (TYLENOL ) 325 MG tablet Take 2 tablets (650 mg total) by mouth every 4 (four) hours as needed for headache or mild pain. 30 tablet 0   Calcium  Carbonate-Vitamin D  (CALCIUM -VITAMIN D3 PO) Take 1 tablet by mouth daily.     lactase (LACTAID) 3000 units tablet Take 3,000 Units by mouth daily as needed (for lactose intolerant).      loratadine (CLARITIN) 10 MG tablet Take 10 mg by mouth daily as needed for allergies.     naproxen sodium (ALEVE) 220 MG tablet Take 220 mg by mouth daily as needed (for pain or headache).     OVER THE COUNTER MEDICATION Eye drops as needed.     No current facility-administered medications on file prior to visit.   "

## 2024-09-02 NOTE — Assessment & Plan Note (Signed)
 Continue weight bearing exercise.  Repeat bone density scan pending.  Consider Prolia injections. Failed Fosamax and Boniva  due to side effects.

## 2024-09-02 NOTE — Patient Instructions (Signed)
 Stop by the lab prior to leaving today. I will notify you of your results once received.   [facility] density scan for June.  It was a pleasure to see you today!

## 2024-09-03 ENCOUNTER — Ambulatory Visit: Payer: PPO

## 2024-09-03 ENCOUNTER — Ambulatory Visit: Payer: Self-pay | Admitting: Primary Care

## 2024-09-03 ENCOUNTER — Other Ambulatory Visit

## 2024-09-03 DIAGNOSIS — D649 Anemia, unspecified: Secondary | ICD-10-CM

## 2024-09-03 LAB — COMPREHENSIVE METABOLIC PANEL WITH GFR
ALT: 10 U/L (ref 3–35)
AST: 15 U/L (ref 5–37)
Albumin: 4.5 g/dL (ref 3.5–5.2)
Alkaline Phosphatase: 57 U/L (ref 39–117)
BUN: 30 mg/dL — ABNORMAL HIGH (ref 6–23)
CO2: 30 meq/L (ref 19–32)
Calcium: 9.6 mg/dL (ref 8.4–10.5)
Chloride: 105 meq/L (ref 96–112)
Creatinine, Ser: 0.89 mg/dL (ref 0.40–1.20)
GFR: 62.95 mL/min
Glucose, Bld: 94 mg/dL (ref 70–99)
Potassium: 3.8 meq/L (ref 3.5–5.1)
Sodium: 141 meq/L (ref 135–145)
Total Bilirubin: 0.4 mg/dL (ref 0.2–1.2)
Total Protein: 6.6 g/dL (ref 6.0–8.3)

## 2024-09-03 LAB — LIPID PANEL
Cholesterol: 211 mg/dL — ABNORMAL HIGH (ref 28–200)
HDL: 58.5 mg/dL
LDL Cholesterol: 132 mg/dL — ABNORMAL HIGH (ref 10–99)
NonHDL: 152.02
Total CHOL/HDL Ratio: 4
Triglycerides: 98 mg/dL (ref 10.0–149.0)
VLDL: 19.6 mg/dL (ref 0.0–40.0)

## 2024-09-03 LAB — CBC
HCT: 35.1 % — ABNORMAL LOW (ref 36.0–46.0)
Hemoglobin: 11.8 g/dL — ABNORMAL LOW (ref 12.0–15.0)
MCHC: 33.7 g/dL (ref 30.0–36.0)
MCV: 92.1 fl (ref 78.0–100.0)
Platelets: 235 10*3/uL (ref 150.0–400.0)
RBC: 3.81 Mil/uL — ABNORMAL LOW (ref 3.87–5.11)
RDW: 12.9 % (ref 11.5–15.5)
WBC: 6.5 10*3/uL (ref 4.0–10.5)

## 2024-09-27 ENCOUNTER — Ambulatory Visit: Admitting: Dermatology

## 2025-01-06 ENCOUNTER — Ambulatory Visit

## 2025-01-13 ENCOUNTER — Other Ambulatory Visit

## 2025-09-06 ENCOUNTER — Encounter: Admitting: Primary Care
